# Patient Record
Sex: Female | Born: 1965 | Race: Black or African American | Hispanic: No | Marital: Single | Smoking: Never smoker
Health system: Southern US, Community
[De-identification: ages and names within clinical notes are randomized; demographics above are authoritative.]

## PROBLEM LIST (undated history)

## (undated) DIAGNOSIS — I509 Heart failure, unspecified: Secondary | ICD-10-CM

## (undated) DIAGNOSIS — C801 Malignant (primary) neoplasm, unspecified: Secondary | ICD-10-CM

## (undated) HISTORY — PX: HAND SURGERY: SHX662

## (undated) HISTORY — PX: ABDOMINAL HYSTERECTOMY: SHX81

---

## 2004-10-16 ENCOUNTER — Emergency Department: Payer: Self-pay | Admitting: Emergency Medicine

## 2005-08-20 ENCOUNTER — Ambulatory Visit: Payer: Self-pay | Admitting: Obstetrics and Gynecology

## 2006-04-06 ENCOUNTER — Emergency Department: Payer: Self-pay | Admitting: General Practice

## 2006-10-23 ENCOUNTER — Emergency Department: Payer: Self-pay

## 2006-11-24 ENCOUNTER — Ambulatory Visit: Payer: Self-pay | Admitting: Emergency Medicine

## 2008-01-08 ENCOUNTER — Ambulatory Visit: Payer: Self-pay | Admitting: Family Medicine

## 2009-02-02 HISTORY — PX: BREAST BIOPSY: SHX20

## 2011-06-08 ENCOUNTER — Emergency Department: Payer: Self-pay | Admitting: Emergency Medicine

## 2011-06-08 LAB — CBC
MCHC: 30.9 g/dL — ABNORMAL LOW (ref 32.0–36.0)
MCV: 89 fL (ref 80–100)
Platelet: 295 10*3/uL (ref 150–440)
RBC: 2.82 10*6/uL — ABNORMAL LOW (ref 3.80–5.20)
RDW: 15.2 % — ABNORMAL HIGH (ref 11.5–14.5)
WBC: 8.9 10*3/uL (ref 3.6–11.0)

## 2011-06-08 LAB — COMPREHENSIVE METABOLIC PANEL
Alkaline Phosphatase: 60 U/L (ref 50–136)
BUN: 13 mg/dL (ref 7–18)
Bilirubin,Total: 0.2 mg/dL (ref 0.2–1.0)
Chloride: 101 mmol/L (ref 98–107)
Co2: 28 mmol/L (ref 21–32)
Creatinine: 1.49 mg/dL — ABNORMAL HIGH (ref 0.60–1.30)
Glucose: 90 mg/dL (ref 65–99)
Potassium: 4.1 mmol/L (ref 3.5–5.1)
SGPT (ALT): 11 U/L — ABNORMAL LOW
Sodium: 132 mmol/L — ABNORMAL LOW (ref 136–145)
Total Protein: 10.8 g/dL — ABNORMAL HIGH (ref 6.4–8.2)

## 2011-11-03 DIAGNOSIS — C801 Malignant (primary) neoplasm, unspecified: Secondary | ICD-10-CM

## 2011-11-03 HISTORY — DX: Malignant (primary) neoplasm, unspecified: C80.1

## 2013-04-03 ENCOUNTER — Emergency Department: Payer: Self-pay | Admitting: Emergency Medicine

## 2013-04-03 LAB — COMPREHENSIVE METABOLIC PANEL
AST: 33 U/L (ref 15–37)
Albumin: 3.4 g/dL (ref 3.4–5.0)
Alkaline Phosphatase: 79 U/L
Anion Gap: 4 — ABNORMAL LOW (ref 7–16)
BILIRUBIN TOTAL: 0.6 mg/dL (ref 0.2–1.0)
BUN: 10 mg/dL (ref 7–18)
CALCIUM: 8.8 mg/dL (ref 8.5–10.1)
CHLORIDE: 106 mmol/L (ref 98–107)
CREATININE: 0.82 mg/dL (ref 0.60–1.30)
Co2: 26 mmol/L (ref 21–32)
EGFR (Non-African Amer.): >60
Glucose: 86 mg/dL (ref 65–99)
OSMOLALITY: 270 (ref 275–301)
POTASSIUM: 3.8 mmol/L (ref 3.5–5.1)
SGPT (ALT): 17 U/L (ref 12–78)
Sodium: 136 mmol/L (ref 136–145)
Total Protein: 7.5 g/dL (ref 6.4–8.2)

## 2013-04-03 LAB — CBC WITH DIFFERENTIAL/PLATELET
BASOS ABS: 0.1 10*3/uL (ref 0.0–0.1)
BASOS PCT: 1 %
EOS PCT: 4.3 %
Eosinophil #: 0.2 10*3/uL (ref 0.0–0.7)
HCT: 35.7 % (ref 35.0–47.0)
HGB: 11.1 g/dL — AB (ref 12.0–16.0)
LYMPHS ABS: 1.2 10*3/uL (ref 1.0–3.6)
Lymphocyte %: 21 %
MCH: 27.7 pg (ref 26.0–34.0)
MCHC: 31 g/dL — ABNORMAL LOW (ref 32.0–36.0)
MCV: 89 fL (ref 80–100)
Monocyte #: 0.7 x10 3/mm (ref 0.2–0.9)
Monocyte %: 12.1 %
NEUTROS PCT: 61.6 %
Neutrophil #: 3.6 10*3/uL (ref 1.4–6.5)
Platelet: 313 10*3/uL (ref 150–440)
RBC: 3.99 10*6/uL (ref 3.80–5.20)
RDW: 15.8 % — ABNORMAL HIGH (ref 11.5–14.5)
WBC: 5.8 10*3/uL (ref 3.6–11.0)

## 2013-04-03 LAB — URINALYSIS, COMPLETE
Bacteria: NONE SEEN
Bilirubin,UR: NEGATIVE
Blood: NEGATIVE
Glucose,UR: NEGATIVE mg/dL (ref 0–75)
Leukocyte Esterase: NEGATIVE
Nitrite: NEGATIVE
Ph: 5 (ref 4.5–8.0)
Protein: NEGATIVE
RBC,UR: 2 /HPF (ref 0–5)
Specific Gravity: 1.016 (ref 1.003–1.030)
Squamous Epithelial: 1

## 2014-05-06 ENCOUNTER — Ambulatory Visit
Admit: 2014-05-06 | Disposition: A | Discharge status: Home / Self Care | Payer: Self-pay | Attending: Internal Medicine | Admitting: Internal Medicine

## 2014-11-26 ENCOUNTER — Ambulatory Visit: Payer: Medicaid Other

## 2014-11-26 ENCOUNTER — Encounter: Payer: Self-pay | Admitting: Emergency Medicine

## 2014-11-26 ENCOUNTER — Ambulatory Visit
Admission: EM | Admit: 2014-11-26 | Discharge: 2014-11-26 | Disposition: A | Discharge status: Home / Self Care | Payer: Medicaid Other | Attending: Family Medicine | Admitting: Family Medicine

## 2014-11-26 DIAGNOSIS — X501XXA Overexertion from prolonged static or awkward postures, initial encounter: Secondary | ICD-10-CM | POA: Diagnosis not present

## 2014-11-26 DIAGNOSIS — S86911A Strain of unspecified muscle(s) and tendon(s) at lower leg level, right leg, initial encounter: Secondary | ICD-10-CM | POA: Insufficient documentation

## 2014-11-26 DIAGNOSIS — M25561 Pain in right knee: Secondary | ICD-10-CM | POA: Diagnosis present

## 2014-11-26 DIAGNOSIS — S86811A Strain of other muscle(s) and tendon(s) at lower leg level, right leg, initial encounter: Secondary | ICD-10-CM

## 2014-11-26 MED ORDER — KETOROLAC TROMETHAMINE 60 MG/2ML IM SOLN
60.0000 mg | Freq: Once | INTRAMUSCULAR | Status: AC
  Administered 2014-11-26: 60 mg via INTRAMUSCULAR

## 2014-11-26 MED ORDER — MELOXICAM 15 MG PO TABS: 15.0000 mg | ORAL_TABLET | Freq: Every day | ORAL | Status: DC

## 2014-11-26 NOTE — ED Provider Notes (Signed)
CSN: [id number]     Arrival date & time 11/26/14  1132 History   First MD Initiated Contact with Patient 11/26/14 1212    Nurses notes were reviewed. Chief Complaint  Patient presents with  . Knee Pain   Patient reports right knee started bothering her last week Thursday by 11 days ago. She states she thinks she twisted her knee but does not remember following hitting or injuring her knee otherwise. She saw her PCP last Tuesday for URI and try to mention about the knee pain but the focus was on the URI. She worked this weekend and reports the right knee is bothering her more now she's having pain walking and standing on it.    (Consider location/radiation/quality/duration/timing/severity/associated sxs/prior Treatment) Patient is a 49 y.o. female presenting with knee pain. The history is provided by the patient. No language interpreter was used.  Knee Pain Location:  Knee Injury: yes   Mechanism of injury comment:  Twist Knee location:  R knee Pain details:    Quality:  Shooting, aching and sharp   Radiates to:  Does not radiate   Severity:  Moderate   Timing:  Constant   Progression:  Worsening Chronicity:  New Foreign body present:  No foreign bodies Prior injury to area:  No Relieved by:  Nothing Worsened by:  Activity Ineffective treatments:  None tried Associated symptoms: stiffness and swelling   Associated symptoms: no back pain, no fever, no itching, no muscle weakness and no neck pain   Risk factors: obesity   Risk factors: no concern for non-accidental trauma, no frequent fractures, no known bone disorder and no recent illness     History reviewed. No pertinent past medical history. Past Surgical History  Procedure Laterality Date  . Abdominal hysterectomy    . Hand surgery     History reviewed. No pertinent family history. Social History  Substance Use Topics  . Smoking status: Never Smoker   . Smokeless tobacco: None  . Alcohol Use: No   OB History    No  data available     Review of Systems  Constitutional: Negative for fever.  Respiratory: Positive for cough.        Being treated now for URI  Musculoskeletal: Positive for myalgias, joint swelling and stiffness. Negative for back pain, arthralgias and neck pain.  Skin: Negative for itching.  All other systems reviewed and are negative.   Allergies  Review of patient's allergies indicates no known allergies.  Home Medications   Prior to Admission medications   Medication Sig Start Date End Date Taking? Authorizing Provider  gabapentin (NEURONTIN) 100 MG capsule Take 100 mg by mouth 3 (three) times daily.   Yes Historical Provider, MD  gabapentin (NEURONTIN) 300 MG capsule Take 300 mg by mouth 3 (three) times daily.   Yes Historical Provider, MD  lenalidomide (REVLIMID) 15 MG capsule Take 15 mg by mouth daily.   Yes Historical Provider, MD  oxyCODONE-acetaminophen (PERCOCET) 10-325 MG tablet Take 1 tablet by mouth every 4 (four) hours as needed for pain.   Yes Historical Provider, MD  valACYclovir (VALTREX) 1000 MG tablet Take 1,000 mg by mouth 2 (two) times daily.   Yes Historical Provider, MD  meloxicam (MOBIC) 15 MG tablet Take 1 tablet (15 mg total) by mouth daily. 11/26/14   Frederich Cha, MD   Meds Ordered and Administered this Visit   Medications  ketorolac (TORADOL) injection 60 mg (60 mg Intramuscular Given 11/26/14 1258)    BP 116/65  mmHg  Pulse 81  Temp(Src) 98.4 F (36.9 C) (Oral)  Resp 18  Ht 5\' 3"  (1.6 m)  Wt 248 lb (112.492 kg)  BMI 43.94 kg/m2  SpO2 100% No data found.   Physical Exam  Constitutional: She is oriented to person, place, and time. She appears well-developed and well-nourished.  HENT:  Head: Normocephalic and atraumatic.  Eyes: Conjunctivae are normal. Pupils are equal, round, and reactive to light.  Neck: Normal range of motion. No thyromegaly present.  Musculoskeletal: Normal range of motion. She exhibits no edema.       Right knee: She  exhibits swelling. She exhibits no LCL laxity and no MCL laxity. Tenderness found.       Legs: Neurological: She is alert and oriented to person, place, and time. She has normal reflexes.  Skin: Skin is warm and dry.  Psychiatric: She has a normal mood and affect.  Vitals reviewed.   ED Course  Procedures (including critical care time)  Labs Review Labs Reviewed - No data to display  Imaging Review Dg Knee Complete 4 Views Right  11/26/2014  CLINICAL DATA:  Patient slipped at work 1 week ago, without falling, but with a twisting injury to the right knee. Complaining of anterior knee pain. EXAM: RIGHT KNEE - COMPLETE 4+ VIEW COMPARISON:  None. FINDINGS: There is no evidence of fracture, dislocation, or joint effusion. There is no evidence of arthropathy or other focal bone abnormality. Soft tissues are unremarkable. IMPRESSION: Negative. Electronically Signed   By: Lajean Manes M.D.   On: 11/26/2014 12:58     Visual Acuity Review  Right Eye Distance:   Left Eye Distance:   Bilateral Distance:    Right Eye Near:   Left Eye Near:    Bilateral Near:         MDM   1. Knee strain, right, initial encounter    Patient was informed x-ray was negative. She was given Mobic for pain as an outpatient and to follow-up with her PCP on the ninth is scheduled if not better. Recommend cryotherapy. We'll give her a note for work for today and tomorrow. And should be noted that shot of Toradol 60 mg IM was also ordered patient with some improvement. She was also instructed to get a sleeve for the right knee for support.    Frederich Cha, MD 11/26/14 [phone]

## 2014-11-26 NOTE — Discharge Instructions (Signed)
Cryotherapy Cryotherapy is when you put ice on your injury. Ice helps lessen pain and puffiness (swelling) after an injury. Ice works the best when you start using it in the first 24 to 48 hours after an injury. HOME CARE  Put a dry or damp towel between the ice pack and your skin.  You may press gently on the ice pack.  Leave the ice on for no more than 10 to 20 minutes at a time.  Check your skin after 5 minutes to make sure your skin is okay.  Rest at least 20 minutes between ice pack uses.  Stop using ice when your skin loses feeling (numbness).  Do not use ice on someone who cannot tell you when it hurts. This includes small children and people with memory problems (dementia). GET HELP RIGHT AWAY IF:  You have white spots on your skin.  Your skin turns blue or pale.  Your skin feels waxy or hard.  Your puffiness gets worse. MAKE SURE YOU:   Understand these instructions.  Will watch your condition.  Will get help right away if you are not doing well or get worse.   This information is not intended to replace advice given to you by your health care provider. Make sure you discuss any questions you have with your health care provider.   Document Released: 07/08/2007 Document Revised: 04/13/2011 Document Reviewed: 09/11/2010 Elsevier Interactive Patient Education 2016 [place].  Muscle Strain A muscle strain (pulled muscle) happens when a muscle is stretched beyond normal length. It happens when a sudden, violent force stretches your muscle too far. Usually, a few of the fibers in your muscle are torn. Muscle strain is common in athletes. Recovery usually takes 1-2 weeks. Complete healing takes 5-6 weeks.  HOME CARE   Follow the PRICE method of treatment to help your injury get better. Do this the first 2-3 days after the injury:  Protect. Protect the muscle to keep it from getting injured again.  Rest. Limit your activity and rest the injured body part.  Ice.  Put ice in a plastic bag. Place a towel between your skin and the bag. Then, apply the ice and leave it on from 15-20 minutes each hour. After the third day, switch to moist heat packs.  Compression. Use a splint or elastic bandage on the injured area for comfort. Do not put it on too tightly.  Elevate. Keep the injured body part above the level of your heart.  Only take medicine as told by your doctor.  Warm up before doing exercise to prevent future muscle strains. GET HELP IF:   You have more pain or puffiness (swelling) in the injured area.  You feel numbness, tingling, or notice a loss of strength in the injured area. MAKE SURE YOU:   Understand these instructions.  Will watch your condition.  Will get help right away if you are not doing well or get worse.   This information is not intended to replace advice given to you by your health care provider. Make sure you discuss any questions you have with your health care provider.   Document Released: 10/29/2007 Document Revised: 11/09/2012 Document Reviewed: 08/18/2012 Elsevier Interactive Patient Education [employer].

## 2014-11-26 NOTE — ED Notes (Signed)
Patient states she is having a lot of pain in her left knee, doesn't remember any injury, started hurting on the 13th. Saw her PCP last week but didn't address the knee pain

## 2014-12-07 ENCOUNTER — Other Ambulatory Visit: Payer: Self-pay | Admitting: Family Medicine

## 2014-12-10 ENCOUNTER — Other Ambulatory Visit: Payer: Self-pay | Admitting: Family Medicine

## 2014-12-10 DIAGNOSIS — Z1231 Encounter for screening mammogram for malignant neoplasm of breast: Secondary | ICD-10-CM

## 2014-12-12 ENCOUNTER — Ambulatory Visit
Admission: RE | Admit: 2014-12-12 | Discharge: 2014-12-12 | Disposition: A | Discharge status: Home / Self Care | Payer: Medicaid Other | Source: Ambulatory Visit | Attending: Family Medicine | Admitting: Family Medicine

## 2014-12-12 DIAGNOSIS — Z1231 Encounter for screening mammogram for malignant neoplasm of breast: Secondary | ICD-10-CM | POA: Insufficient documentation

## 2014-12-12 HISTORY — DX: Malignant (primary) neoplasm, unspecified: C80.1

## 2014-12-17 ENCOUNTER — Other Ambulatory Visit: Payer: Self-pay | Admitting: *Deleted

## 2014-12-17 ENCOUNTER — Inpatient Hospital Stay
Admission: RE | Admit: 2014-12-17 | Discharge: 2014-12-17 | Disposition: A | Discharge status: Home / Self Care | Payer: Self-pay | Source: Ambulatory Visit | Attending: *Deleted | Admitting: *Deleted

## 2014-12-17 DIAGNOSIS — Z9289 Personal history of other medical treatment: Secondary | ICD-10-CM

## 2015-10-14 ENCOUNTER — Ambulatory Visit
Admission: EM | Admit: 2015-10-14 | Discharge: 2015-10-14 | Disposition: A | Discharge status: Home / Self Care | Payer: Managed Care, Other (non HMO) | Attending: Family Medicine | Admitting: Family Medicine

## 2015-10-14 DIAGNOSIS — S76312A Strain of muscle, fascia and tendon of the posterior muscle group at thigh level, left thigh, initial encounter: Secondary | ICD-10-CM | POA: Diagnosis not present

## 2015-10-14 MED ORDER — KETOROLAC TROMETHAMINE 60 MG/2ML IM SOLN
60.0000 mg | Freq: Once | INTRAMUSCULAR | Status: AC
  Administered 2015-10-14: 60 mg via INTRAMUSCULAR

## 2015-10-14 MED ORDER — CYCLOBENZAPRINE HCL 10 MG PO TABS: 10.0000 mg | ORAL_TABLET | Freq: Every day | ORAL | 0 refills | Status: DC

## 2015-10-14 NOTE — ED Triage Notes (Signed)
Patient c/o of leg pain on the back of her left upper leg. She states she got a new bed and its higher upper and on Saturday she stepped out of bed and felt something rip or tear in her leg.

## 2015-10-14 NOTE — ED Provider Notes (Signed)
[facility]    CSN: [id number] Arrival date & time: 10/14/15  1849  First Provider Contact:  None       History   Chief Complaint Chief Complaint  Patient presents with  . Leg Pain    HPI Carly Randall is a 50 y.o. female.   50 yo female with a c/o 3 days h/o left upper leg back on the back of the leg. States 3 days ago was getting out of her new bed and felt a sudden pull and pain to the back of the upper left leg. Denies any falls or direct trauma. Denies fevers, chills, numbness/tingling.    The history is provided by the patient.  Leg Pain    Past Medical History:  Diagnosis Date  . Cancer ([place]) 11/2011   multiple myeloma    There are no active problems to display for this patient.   Past Surgical History:  Procedure Laterality Date  . ABDOMINAL HYSTERECTOMY    . BREAST BIOPSY Right 2011   neg  . HAND SURGERY      OB History    No data available       Home Medications    Prior to Admission medications   Medication Sig Start Date End Date Taking? Authorizing Provider  gabapentin (NEURONTIN) 100 MG capsule Take 100 mg by mouth 3 (three) times daily.   Yes Historical Provider, MD  gabapentin (NEURONTIN) 300 MG capsule Take 300 mg by mouth 3 (three) times daily.   Yes Historical Provider, MD  lenalidomide (REVLIMID) 15 MG capsule Take 15 mg by mouth daily.   Yes Historical Provider, MD  meloxicam (MOBIC) 15 MG tablet Take 1 tablet (15 mg total) by mouth daily. 11/26/14  Yes Frederich Cha, MD  oxyCODONE-acetaminophen (PERCOCET) 10-325 MG tablet Take 1 tablet by mouth every 4 (four) hours as needed for pain.   Yes Historical Provider, MD  valACYclovir (VALTREX) 1000 MG tablet Take 1,000 mg by mouth 2 (two) times daily.   Yes Historical Provider, MD  cyclobenzaprine (FLEXERIL) 10 MG tablet Take 1 tablet (10 mg total) by mouth at bedtime. 10/14/15   Norval Gable, MD    Family History History reviewed. No pertinent family  history.  Social History Social History  Substance Use Topics  . Smoking status: Never Smoker  . Smokeless tobacco: Never Used  . Alcohol use No     Allergies   Percocet [oxycodone-acetaminophen]   Review of Systems Review of Systems   Physical Exam Triage Vital Signs ED Triage Vitals  Enc Vitals Group     BP 10/14/15 1900 97/72     Pulse Rate 10/14/15 1900 82     Resp 10/14/15 1900 18     Temp 10/14/15 1900 97.9 F (36.6 C)     Temp Source 10/14/15 1900 Oral     SpO2 10/14/15 1900 100 %     Weight 10/14/15 1901 255 lb (115.7 kg)     Height 10/14/15 1901 _0  (1.626 m)     Head Circumference --      Peak Flow --      Pain Score 10/14/15 1905 6     Pain Loc --      Pain Edu? --      Excl. in [place]? --    No data found.   Updated Vital Signs BP 97/72 (BP Location: Left Arm)   Pulse 82   Temp 97.9 F (36.6 C) (Oral)   Resp 18  Ht _0  (1.626 m)   Wt 255 lb (115.7 kg)   SpO2 100%   BMI 43.77 kg/m   Visual Acuity Right Eye Distance:   Left Eye Distance:   Bilateral Distance:    Right Eye Near:   Left Eye Near:    Bilateral Near:     Physical Exam  Constitutional: She appears well-developed and well-nourished. No distress.  Musculoskeletal:       Left upper leg: She exhibits no bony tenderness, no swelling, no edema, no deformity and no laceration. Tenderness: left hamstring muscle tenderness to palpation.  Skin: She is not diaphoretic.  Nursing note and vitals reviewed.    UC Treatments / Results  Labs (all labs ordered are listed, but only abnormal results are displayed) Labs Reviewed - No data to display  EKG  EKG Interpretation None       Radiology No results found.  Procedures Procedures (including critical care time)  Medications Ordered in UC Medications  ketorolac (TORADOL) injection 60 mg (60 mg Intramuscular Given 10/14/15 1957)     Initial Impression / Assessment and Plan / UC Course  I have reviewed the triage vital  signs and the nursing notes.  Pertinent labs & imaging results that were available during my care of the patient were reviewed by me and considered in my medical decision making (see chart for details).  Clinical Course     Final Clinical Impressions(s) / UC Diagnoses   Final diagnoses:  Hamstring strain, left, initial encounter    New Prescriptions New Prescriptions   CYCLOBENZAPRINE (FLEXERIL) 10 MG TABLET    Take 1 tablet (10 mg total) by mouth at bedtime.   1. diagnosis reviewed with patient 2. Patient given toradol 93m IM x 1 3. rx as per orders above; reviewed possible side effects, interactions, risks and benefits  4. Recommend supportive treatment with ice, rest, otc analgesics, pain medication that patient has at home 5. Follow-up prn if symptoms worsen or don't improve   ONorval Gable MD 10/14/15 2003

## 2018-10-25 ENCOUNTER — Ambulatory Visit
Admission: EM | Admit: 2018-10-25 | Discharge: 2018-10-25 | Disposition: A | Discharge status: Home / Self Care | Payer: Managed Care, Other (non HMO) | Attending: Family Medicine | Admitting: Family Medicine

## 2018-10-25 ENCOUNTER — Other Ambulatory Visit: Payer: Self-pay

## 2018-10-25 DIAGNOSIS — M7918 Myalgia, other site: Secondary | ICD-10-CM

## 2018-10-25 MED ORDER — METAXALONE 800 MG PO TABS: 800.0000 mg | ORAL_TABLET | Freq: Three times a day (TID) | ORAL | 0 refills | Status: DC | PRN

## 2018-10-25 NOTE — ED Provider Notes (Signed)
[facility]    CSN: [id number] Arrival date & time: 10/25/18  1425  History   Chief Complaint Chief Complaint  Patient presents with  . Shoulder Pain    right   HPI  53 year old female presents with the above complaint.  Patient reports that she has had right shoulder pain intermittently for months.  She states that it has been going on since July.  She reports that it has been worse since Saturday.  She localizes the pain to the trapezius muscle and around the scapula.  She also seems to have pain over the [facility] joint.  She reports decreased range of motion.  She has taken Tylenol without relief.  She rates her pain is 10/10 in severity.  She states that it is worse with pressure and she has difficulty wearing her bra as a result.  Worse with range of motion as well.  No relieving factors.  No other associated symptoms.  No other complaints.  PMH, Surgical Hx, Family Hx, Social History reviewed and updated as below.  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    Surgical Hx:  BREAST SURGERY   benign biopsy   HYSTERECTOMY 02/03/2008 - 02/01/2009  partial   BONE MARROW TRANSPLANT      CARPAL TUNNEL RELEASE  Right    CARPAL TUNNEL RELEASE  Left    PR CATH PLACE/CORON ANGIO, IMG SUPER/INTERP,R&L HRT CATH, L HRT VENTRIC 05/30/2015 N/A Procedure: Left/Right Heart Catheterization; Surgeon: Melvern Banker, MD; Location: [facility] CATH; Service: Cardiology    ENDOMETRIAL ABLATION 02/03/1996 - 02/01/1997     UTERINE FIBROID SURGERY      BREAST EXCISIONAL BIOPSY 02/02/2009 - 02/01/2010 Right    PR COLONOSCOPY FLX DX W/COLLJ SPEC WHEN PFRMD 01/31/2016 N/A Procedure: COLONOSCOPY, FLEXIBLE, PROXIMAL TO SPLENIC FLEXURE; DIAGNOSTIC, W/WO COLLECTION SPECIMEN BY BRUSH OR [place]; Surgeon: Valma Cava, MD; Location: [facility] PROCEDURES [facility]; Service: Gastroenterology   PR INSJ/RPLCMT PERM DFB W/TRNSVNS LDS 1/DUAL [facility] 05/13/2017 N/A Procedure: ICD Implant System (Single/Dual); Surgeon: Arcola Jansky, MD; Location: [facility] EP; Service: Cardiology  Medical devices from this surgery are in the Implants section.   PR COLONOSCOPY FLX DX W/COLLJ SPEC WHEN PFRMD 05/25/2017 N/A Procedure: COLONOSCOPY, FLEXIBLE, PROXIMAL TO SPLENIC FLEXURE; DIAGNOSTIC, W/WO COLLECTION SPECIMEN BY BRUSH OR [place]; Surgeon: Traci Sermon, MD; [facility]: [facility]; Service: Gastroenterology   PR UPPER GI ENDOSCOPY,DIAGNOSIS 05/25/2017 N/A Procedure: UGI ENDO, INCLUDE ESOPHAGUS, STOMACH, & DUODENUM &/OR JEJUNUM; DX W/WO COLLECTION SPECIMN, BY BRUSH OR [place]; Surgeon: Traci Sermon, MD; [facility]: [facility]; Service: Gastroenterology   PR GI IMAG INTRALUMINAL ESOPHAGUS-ILEUM W/I&R 08/20/2017 N/A Procedure: GI VIDEO TRACT IMAGE INTRALUMINAL (EG, CAPSULE ENDOSCOPY), ESOPHAGUS VIA ILEUM, PHYSICIAN INTERPRETATION & REPORT; Surgeon: Physician Giproc; [facility]: [facility]; Service: Gastroenterology   PR DESTRUCT INTERNAL HEMORRHOID, THERMAL 09/22/2017 N/A Procedure: DESTRUCTION OF INTERNAL HEMORRHOID(S) BY THERMAL ENERGY; Surgeon: Susy Frizzle, MD; Location: GI PROCEDURES [facility]; Service: Gastroenterology   PR PERQ CLSR TCAT L ATR APNDGE W/ENDOCARDIAL IMPLNT 04/20/2018 N/A Procedure: Left Atrial Appendage Closure; Surgeon: Arcola Jansky, MD; Location: [facility] EP; Service: Cardiology  Medical devices from this  surgery are in the Implants section.   PR EPHYS EVL TRNSPTL TX ATRIAL FIB ISOLAT PULM VEIN 07/28/2018 N/A Procedure: Afib Ablation; Surgeon: Arcola Jansky, MD; Location: [facility] EP; Service: Cardiology     Home Medications    Prior to Admission medications   Medication Sig Start Date End Date Taking? Authorizing Provider  amitriptyline (ELAVIL) 25 MG tablet Take by mouth. 07/19/17  Yes [provider]  aspirin EC 81 MG tablet Take by mouth.   Yes [provider]  carvedilol (COREG) 6.25 MG tablet TK 1 T PO BID 10/15/18  Yes [provider]  clopidogrel (PLAVIX) 75 MG tablet Take by mouth. 10/07/18  Yes [provider]  furosemide (LASIX) 20 MG tablet TAKE ONE TABLET BY MOUTH ONCE DAILY 08/18/16  Yes [provider]  gabapentin (NEURONTIN) 100 MG capsule Take 100 mg by mouth 3 (three) times daily.   Yes [provider]  gabapentin (NEURONTIN) 300 MG capsule Take 300 mg by mouth 3 (three) times daily.   Yes [provider]  lenalidomide (REVLIMID) 15 MG capsule Take 15 mg by mouth daily.   Yes [provider]  magnesium 30 MG tablet Take 30 mg by mouth 2 (two) times daily.   Yes [provider]  potassium chloride (KLOR-CON) 20 MEQ packet Take by mouth 2 (two) times daily.   Yes [provider]  sacubitril-valsartan (ENTRESTO) 49-51 MG Take by mouth. 03/04/18 03/04/19 Yes [provider]  valACYclovir (VALTREX) 1000 MG tablet Take 1,000 mg by mouth 2 (two) times daily.   Yes [provider]  metaxalone (SKELAXIN) 800 MG tablet Take 1 tablet (800 mg total) by mouth 3 (three) times daily as needed for muscle spasms (Muscle pain). 10/25/18   Coral Spikes, DO    Family History No Known Problems Brother    Heart failure Father Tanasia Budzinski NICM, LVAD  No Known Problems Maternal Aunt    Stroke Maternal Grandfather Valetta Mole   Cancer Maternal Grandmother Sharyne Peach Lung   No Known Problems Maternal Uncle    Hypothyroidism Mother    No Known Problems Paternal Aunt    Cancer Paternal Grandfather Momoka Stringfield Prostate  Diabetes Paternal Grandmother Caesar Chestnut     Social History Social History   Tobacco Use  . Smoking status: Never Smoker  . Smokeless tobacco: Never Used  Substance Use Topics  . Alcohol use: No  . Drug use: Never    Allergies   Percocet [oxycodone-acetaminophen]   Review of Systems Review of Systems  Constitutional: Negative.   Musculoskeletal:       Shoulder pain.   Physical Exam Triage Vital Signs ED Triage Vitals  Enc Vitals Group     BP 10/25/18 1444 (!) 135/44     Pulse Rate 10/25/18 1444 64     Resp 10/25/18 1444 16     Temp 10/25/18 1444 98 F (36.7 C)     Temp Source 10/25/18 1444 Oral     SpO2 10/25/18 1444 100 %     Weight 10/25/18 1440 255 lb (115.7 kg)     Height 10/25/18 1440 '5\' 4"'  (1.626 m)     Head Circumference --      Peak Flow --      Pain Score  10/25/18 1440 10     Pain Loc --      Pain Edu? --      Excl. in [place]? --    Updated Vital Signs BP (!) 135/44 (BP Location: Left Arm)   Pulse 64   Temp 98 F (36.7 C) (Oral)   Resp 16   Ht '5\' 4"'  (1.626 m)   Wt 115.7 kg   SpO2 100%   BMI 43.77 kg/m   Visual Acuity Right Eye Distance:   Left Eye Distance:   Bilateral Distance:    Right Eye Near:   Left Eye Near:    Bilateral Near:     Physical Exam Vitals signs and nursing note reviewed.  Constitutional:      General: She is not in acute distress.    Appearance: Normal appearance. She is obese. She is not ill-appearing.  HENT:     Head: Normocephalic and atraumatic.  Eyes:     General:        Right eye: No discharge.        Left eye: No discharge.     Conjunctiva/sclera: Conjunctivae normal.  Cardiovascular:     Rate and Rhythm: Normal rate and regular rhythm.     Heart sounds: No murmur.  Pulmonary:     Effort: Pulmonary effort is normal.     Breath sounds: Normal  breath sounds. No wheezing, rhonchi or rales.  Musculoskeletal:     Comments: Right shoulder -inspection unremarkable.  Tenderness over the trapezius muscle as well as the right AC joint.  Normal rotator cuff strength.  No tenderness over the bicipital groove.  Neurological:     Mental Status: She is alert.  Psychiatric:        Mood and Affect: Mood normal.        Behavior: Behavior normal.    UC Treatments / Results  Labs (all labs ordered are listed, but only abnormal results are displayed) Labs Reviewed - No data to display  EKG   Radiology No results found.  Procedures Procedures (including critical care time)  Medications Ordered in UC Medications - No data to display  Initial Impression / Assessment and Plan / UC Course  I have reviewed the triage vital signs and the nursing notes.  Pertinent labs & imaging results that were available during my care of the patient were reviewed by me and considered in my medical decision making (see chart for details).    53 year old female presents with musculoskeletal pain.  This appears to be primarily muscular in nature.  She has good rotator cuff strength.  She is exquisitely tender over the trapezius muscle even with minimal palpation.  Skelaxin as directed.  Advised heat.  Advised to see Ortho if she fails to improve or worsens.  Final Clinical Impressions(s) / UC Diagnoses   Final diagnoses:  Musculoskeletal pain     Discharge Instructions     Rest.  Heat.  Medication as needed.  If persists, see Ortho.  Take care  Dr. Lacinda Axon    ED Prescriptions    Medication Sig Dispense Auth. Provider   metaxalone (SKELAXIN) 800 MG tablet Take 1 tablet (800 mg total) by mouth 3 (three) times daily as needed for muscle spasms (Muscle pain). 30 tablet Coral Spikes, DO     PDMP not reviewed this encounter.   Coral Spikes, [place] 10/25/18 1533

## 2018-10-25 NOTE — Discharge Instructions (Signed)
Rest.  Heat.  Medication as needed.  If persists, see Ortho.  Take care  Dr. Lacinda Axon

## 2018-10-25 NOTE — ED Triage Notes (Signed)
Patient complains of right shoulder pain that started on Saturday, no injury. Pain worse with raising arm.

## 2018-12-28 ENCOUNTER — Ambulatory Visit (INDEPENDENT_AMBULATORY_CARE_PROVIDER_SITE_OTHER): Payer: Managed Care, Other (non HMO)

## 2018-12-28 ENCOUNTER — Encounter: Payer: Self-pay | Admitting: Emergency Medicine

## 2018-12-28 ENCOUNTER — Other Ambulatory Visit: Payer: Self-pay

## 2018-12-28 ENCOUNTER — Ambulatory Visit
Admission: EM | Admit: 2018-12-28 | Discharge: 2018-12-28 | Disposition: A | Discharge status: Home / Self Care | Payer: Managed Care, Other (non HMO) | Attending: Emergency Medicine | Admitting: Emergency Medicine

## 2018-12-28 DIAGNOSIS — M25572 Pain in left ankle and joints of left foot: Secondary | ICD-10-CM

## 2018-12-28 HISTORY — DX: Heart failure, unspecified: I50.9

## 2018-12-28 MED ORDER — HYDROCODONE-ACETAMINOPHEN 5-325 MG PO TABS: 1.0000 | ORAL_TABLET | Freq: Four times a day (QID) | ORAL | 0 refills | Status: DC | PRN

## 2018-12-28 NOTE — Discharge Instructions (Addendum)
Your x-ray showed that you have soft tissue swelling, but no fracture, or lesions concerning for metastasis. wear the ASO for comfort.  Ice for 20 minutes at a time, elevate.  Be careful with icing - make sure that you do not have ice directly against your skin.  Use a cane. Tylenol containing product 3 or 4 times a day as needed for pain.  1000 mg of Tylenol for mild to moderate pain, 1-2 Norco for severe pain.  Follow-up with [facility] if you are not getting better in a week

## 2018-12-28 NOTE — ED Provider Notes (Signed)
HPI  SUBJECTIVE:  Carly Randall is a 53 y.o. female who presents with the acute onset of atraumatic left ankle pain starting yesterday.  States that she woke up with it.  She reports limited range of motion secondary to the pain.  No fevers, body aches, joint erythema, swelling, change in physical activity, fall.  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  She tried Tylenol 1000 mg 3 times daily and using a cane without improvement in her symptoms.  Symptoms are worse with walking, ankle movement.  She has never had symptoms like this before.  She has a past medical history of multiple myeloma in October 2013 status post bone marrow transplant.  She reports prolonged steroid use post transplant.  She is not on any current immunosuppression medications.  She also has a history of CHF, peripheral neuropathy is on Plavix.  No history of gout, left ankle injury, diabetes, osteoporosis.  Family history negative for gout.  PMD: Dr. Posey Pronto at [place]    Past Medical History:  Diagnosis Date  . Cancer ([place]) 11/2011   multiple myeloma  . Congestive heart failure (CHF) [facility])     Past Surgical History:  Procedure Laterality Date  . ABDOMINAL HYSTERECTOMY    . BREAST BIOPSY Right 2011   neg  . HAND SURGERY      Family History  Problem Relation Age of Onset  . Hyperthyroidism Mother   . Heart disease Father     Social History   Tobacco Use  . Smoking status: Never Smoker  . Smokeless tobacco: Never Used  Substance Use Topics  . Alcohol use: No  . Drug use: Never    No current facility-administered medications for this encounter.   Current Outpatient Medications:  .  amitriptyline (ELAVIL) 25 MG tablet, Take by mouth., Disp: , Rfl:  .  aspirin EC 81 MG tablet, Take by mouth., Disp: , Rfl:  .  carvedilol (COREG) 6.25 MG tablet, TK 1 T PO BID, Disp: , Rfl:  .   furosemide (LASIX) 20 MG tablet, TAKE ONE TABLET BY MOUTH ONCE DAILY, Disp: , Rfl:  .  gabapentin (NEURONTIN) 100 MG capsule, Take 100 mg by mouth 3 (three) times daily., Disp: , Rfl:  .  gabapentin (NEURONTIN) 300 MG capsule, Take 300 mg by mouth 3 (three) times daily., Disp: , Rfl:  .  lenalidomide (REVLIMID) 15 MG capsule, Take 15 mg by mouth daily., Disp: , Rfl:  .  magnesium 30 MG tablet, Take 30 mg by mouth 2 (two) times daily., Disp: , Rfl:  .  potassium chloride (KLOR-CON) 20 MEQ packet, Take by mouth 2 (two) times daily., Disp: , Rfl:  .  sacubitril-valsartan (ENTRESTO) 49-51 MG, Take by mouth., Disp: , Rfl:  .  valACYclovir (VALTREX) 1000 MG tablet, Take 1,000 mg by mouth 2 (two) times daily., Disp: , Rfl:  .  clopidogrel (PLAVIX) 75 MG tablet, Take by mouth., Disp: , Rfl:  .  HYDROcodone-acetaminophen (NORCO/VICODIN) 5-325 MG tablet, Take 1-2 tablets by mouth every 6 (six) hours as needed for moderate pain or severe pain., Disp: 12 tablet, Rfl: 0  Allergies  Allergen Reactions  . Filgrastim Other (See Comments)    Other reaction(s): Other (See Comments) Other reaction(s): Other (See Comments) Severe pain Severe pain Severe pain   . Pegfilgrastim     Other reaction(s): Other (See Comments) Severe bone pain Other reaction(s): Other (  See Comments) Severe bone pain   . Percocet [Oxycodone-Acetaminophen] Itching  . Codeine Itching  . Naproxen Itching     ROS  As noted in HPI.   Physical Exam  BP 109/80 (BP Location: Right Arm) Comment (BP Location): forearm  Pulse 61   Temp 98.4 F (36.9 C) (Oral)   Resp 18   Ht '5\' 4"'  (1.626 m)   Wt 122.5 kg   SpO2 100%   BMI 46.35 kg/m   Constitutional: Well developed, well nourished, no acute distress Eyes:  EOMI, conjunctiva normal bilaterally HENT: Normocephalic, atraumatic,mucus membranes moist Respiratory: Normal inspiratory effort Cardiovascular: Normal rate GI: nondistended skin: No rash, skin  intact Musculoskeletal:L Ankle Tenderness entire joint, positive edema. Edema  Symmetric with other ankle.  No erythema.  Pain with plantarflexion/dorsiflexion.  No pain with inversion/eversion.  Proximal fibula NT, Distal fibula  tender, Medial malleolus tender,  Deltoid ligament medially tender,  Lateral ligaments tender, ATFL laterally tender, calcaneofibular ligament laterally NT, posterior tablofibular ligament laterally NT ,  Achilles tender, no palpable tissue defect. negative Thompson test. calcaneus NT,  Proximal 5th metatarsal NT, Midfoot NT, distal NVI with baseline sensation / motor to foot at baseline.  DP 2+. - bruising.  Pt able to bear weight in dept. with difficulty  neurologic: Alert & oriented x 3, no focal neuro deficits Psychiatric: Speech and behavior appropriate   ED Course   Medications - No data to display  Orders Placed This Encounter  Procedures  . DG Ankle Complete Left    Standing Status:   Standing    Number of Occurrences:   1    Order Specific Question:   Reason for Exam (SYMPTOM  OR DIAGNOSIS REQUIRED)    Answer:   Diffuse tenderness.  History of multiple myeloma, prolonged prednisone use.  Rule out fracture, effusion.  Marland Kitchen Apply ASO ankle    Standing Status:   Standing    Number of Occurrences:   1    Order Specific Question:   Laterality    Answer:   Left    No results found for this or any previous visit (from the past 24 hour(s)). Dg Ankle Complete Left  Result Date: 12/28/2018 CLINICAL DATA:  Diffuse tenderness with a history of multiple myeloma. EXAM: LEFT ANKLE COMPLETE - 3+ VIEW COMPARISON:  10/16/2004 FINDINGS: There is soft tissue swelling about the ankle without evidence for an acute displaced fracture or dislocation. There is no evidence for lytic lesion. IMPRESSION: Soft tissue swelling without evidence for an acute displaced fracture or dislocation. No lytic lesion identified. Electronically Signed   By: Constance Holster M.D.   On:  12/28/2018 19:39    ED Clinical Impression  1. Acute left ankle pain      ED Assessment/Plan  [place] Narcotic database reviewed for this patient, and feel that the risk/benefit ratio today is favorable for proceeding with a prescription for controlled substance.  No opiate prescriptions since October 2019.  Had a prescription for Norco which she states that she tolerated.   Imaging L ankle b/c of history of multiple myeloma to rule out mets, fracture because of the prolonged steroid use, effusion.  Doubt gout, infection.  Reviewed imaging independently.  Soft tissue swelling.  No acute displaced fracture or dislocation.  No lytic lesions identified.  See radiology report for full details.  Home with Tylenol containing product 3 or 4 times a day as needed for pain.  1000 mg of Tylenol for mild to moderate pain, 1-2 Norco  for severe pain.  ASO.  She states that she has a cane at home.   Follow-up with [facility] if not better in a week.  Discussed  imaging, MDM, treatment plan, and plan for follow-up with patient. Discussed sn/sx that should prompt return to the ED. patient agrees with plan.   Meds ordered this encounter  Medications  . HYDROcodone-acetaminophen (NORCO/VICODIN) 5-325 MG tablet    Sig: Take 1-2 tablets by mouth every 6 (six) hours as needed for moderate pain or severe pain.    Dispense:  12 tablet    Refill:  0    *This clinic note was created using [employer]. Therefore, there may be occasional mistakes despite careful proofreading.   ?    Melynda Ripple, MD 12/29/18 1200

## 2018-12-28 NOTE — ED Triage Notes (Signed)
Patient in today c/o left ankle pain x 1 day. No injury noted. Patient states she just woke up with the pain yesterday. Patient has used OTC Tylenol and soaked it.

## 2018-12-30 ENCOUNTER — Telehealth: Payer: Self-pay | Admitting: Emergency Medicine

## 2018-12-30 ENCOUNTER — Telehealth: Payer: Self-pay | Admitting: Family Medicine

## 2018-12-30 MED ORDER — HYDROCODONE-ACETAMINOPHEN 5-325 MG PO TABS: 1.0000 | ORAL_TABLET | Freq: Four times a day (QID) | ORAL | 0 refills | Status: DC | PRN

## 2018-12-30 NOTE — Telephone Encounter (Signed)
Re-ordered vicodin as pharmacy called that rx from 11/25 did not go through

## 2018-12-30 NOTE — Telephone Encounter (Signed)
Patient called stating her script for Hydrocodone was not received by [facility] on 11/25. Checked script and saw that transmission had failed to [facility] on 11/25. Advised patient that we would resend to [facility]. Provider re sent prescription but it was sent to [facility] instead. Called and canceled script at [facility] and had provider re send script to [facility]. Patient called back and stated this RN did not  Call her script in. This RN advised patient that this was a script that could not be called in and the provider was re sending the prescription to [facility]. Patient got upset with this RN.

## 2018-12-30 NOTE — Telephone Encounter (Signed)
Resent to [facility]

## 2019-04-14 ENCOUNTER — Ambulatory Visit: Payer: Managed Care, Other (non HMO)

## 2019-05-13 ENCOUNTER — Ambulatory Visit: Payer: Managed Care, Other (non HMO)

## 2019-05-13 ENCOUNTER — Ambulatory Visit: Payer: Managed Care, Other (non HMO) | Attending: Internal Medicine

## 2019-05-13 DIAGNOSIS — Z23 Encounter for immunization: Secondary | ICD-10-CM

## 2019-05-13 NOTE — Progress Notes (Signed)
   Covid-19 Vaccination Clinic  Name:  [name]    MRN: [id number] DOB: March 31, 1965  05/13/2019  [name] was observed post Covid-19 immunization for 15 minutes without incident. She was provided with Vaccine Information Sheet and instruction to access the V-Safe system.   [name] was instructed to call 911 with any severe reactions post vaccine: Marland Kitchen Difficulty breathing  . Swelling of face and throat  . A fast heartbeat  . A bad rash all over body  . Dizziness and weakness   Immunizations Administered    Name Date Dose VIS Date Route   Pfizer COVID-19 Vaccine 05/13/2019  8:34 AM 0.3 mL 01/13/2019 Intramuscular   Manufacturer: [place]   Lot: [id number]   [place]: [id number]

## 2019-06-13 ENCOUNTER — Ambulatory Visit: Payer: Managed Care, Other (non HMO)

## 2019-06-13 ENCOUNTER — Ambulatory Visit: Payer: Managed Care, Other (non HMO) | Attending: Internal Medicine

## 2019-06-13 DIAGNOSIS — Z23 Encounter for immunization: Secondary | ICD-10-CM

## 2019-06-13 NOTE — Progress Notes (Signed)
   Covid-19 Vaccination Clinic  Name:  Carly Randall    MRN: [id number] DOB: 1965/04/28  06/13/2019  [name] was observed post Covid-19 immunization for 15 minutes without incident. She was provided with Vaccine Information Sheet and instruction to access the V-Safe system.   [name] was instructed to call 911 with any severe reactions post vaccine: Marland Kitchen Difficulty breathing  . Swelling of face and throat  . A fast heartbeat  . A bad rash all over body  . Dizziness and weakness   Immunizations Administered    Name Date Dose VIS Date Route   Pfizer COVID-19 Vaccine 06/13/2019  8:15 AM 0.3 mL 03/29/2018 Intramuscular   Manufacturer: [place]   Lot: [id number]   [place]: [id number]

## 2020-09-24 IMAGING — CR DG ANKLE COMPLETE 3+V*L*
3 series · 3 of 3 positions shown · non-contrast
Comparison: 10/16/2004

CLINICAL DATA: Diffuse tenderness with a history of multiple
myeloma.

EXAM:
LEFT ANKLE COMPLETE - 3+ VIEW

[ankle ap]
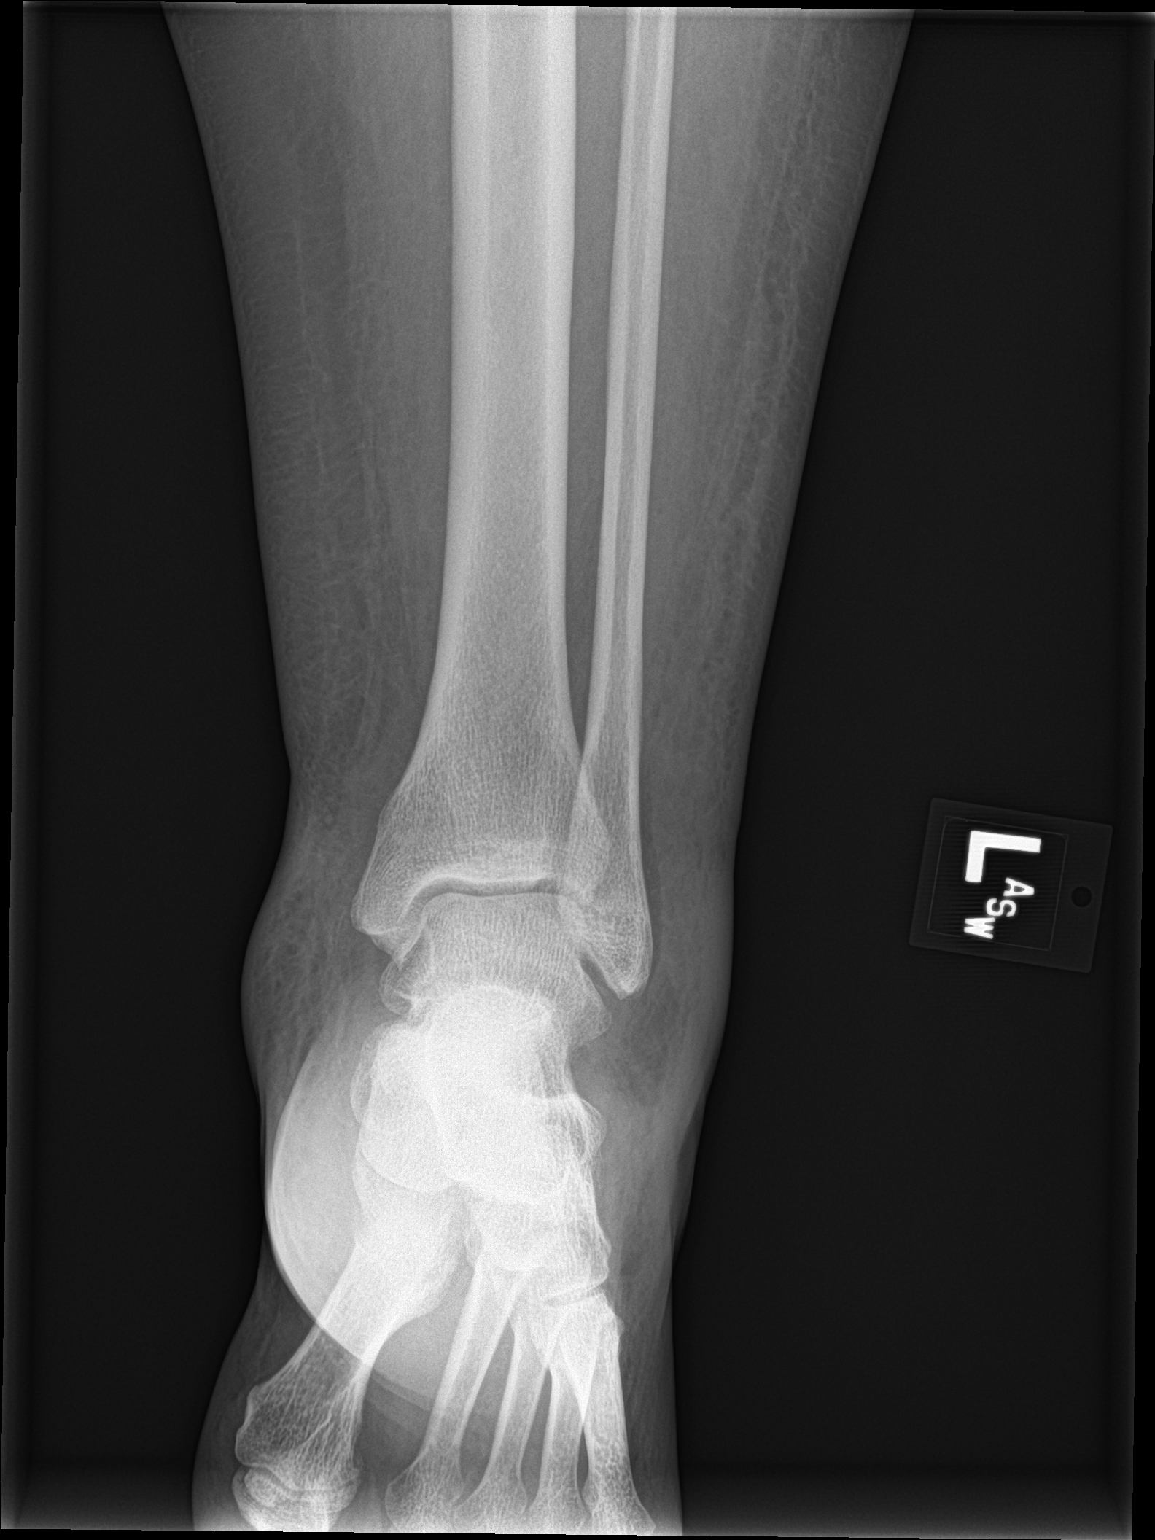

[ankle obl]
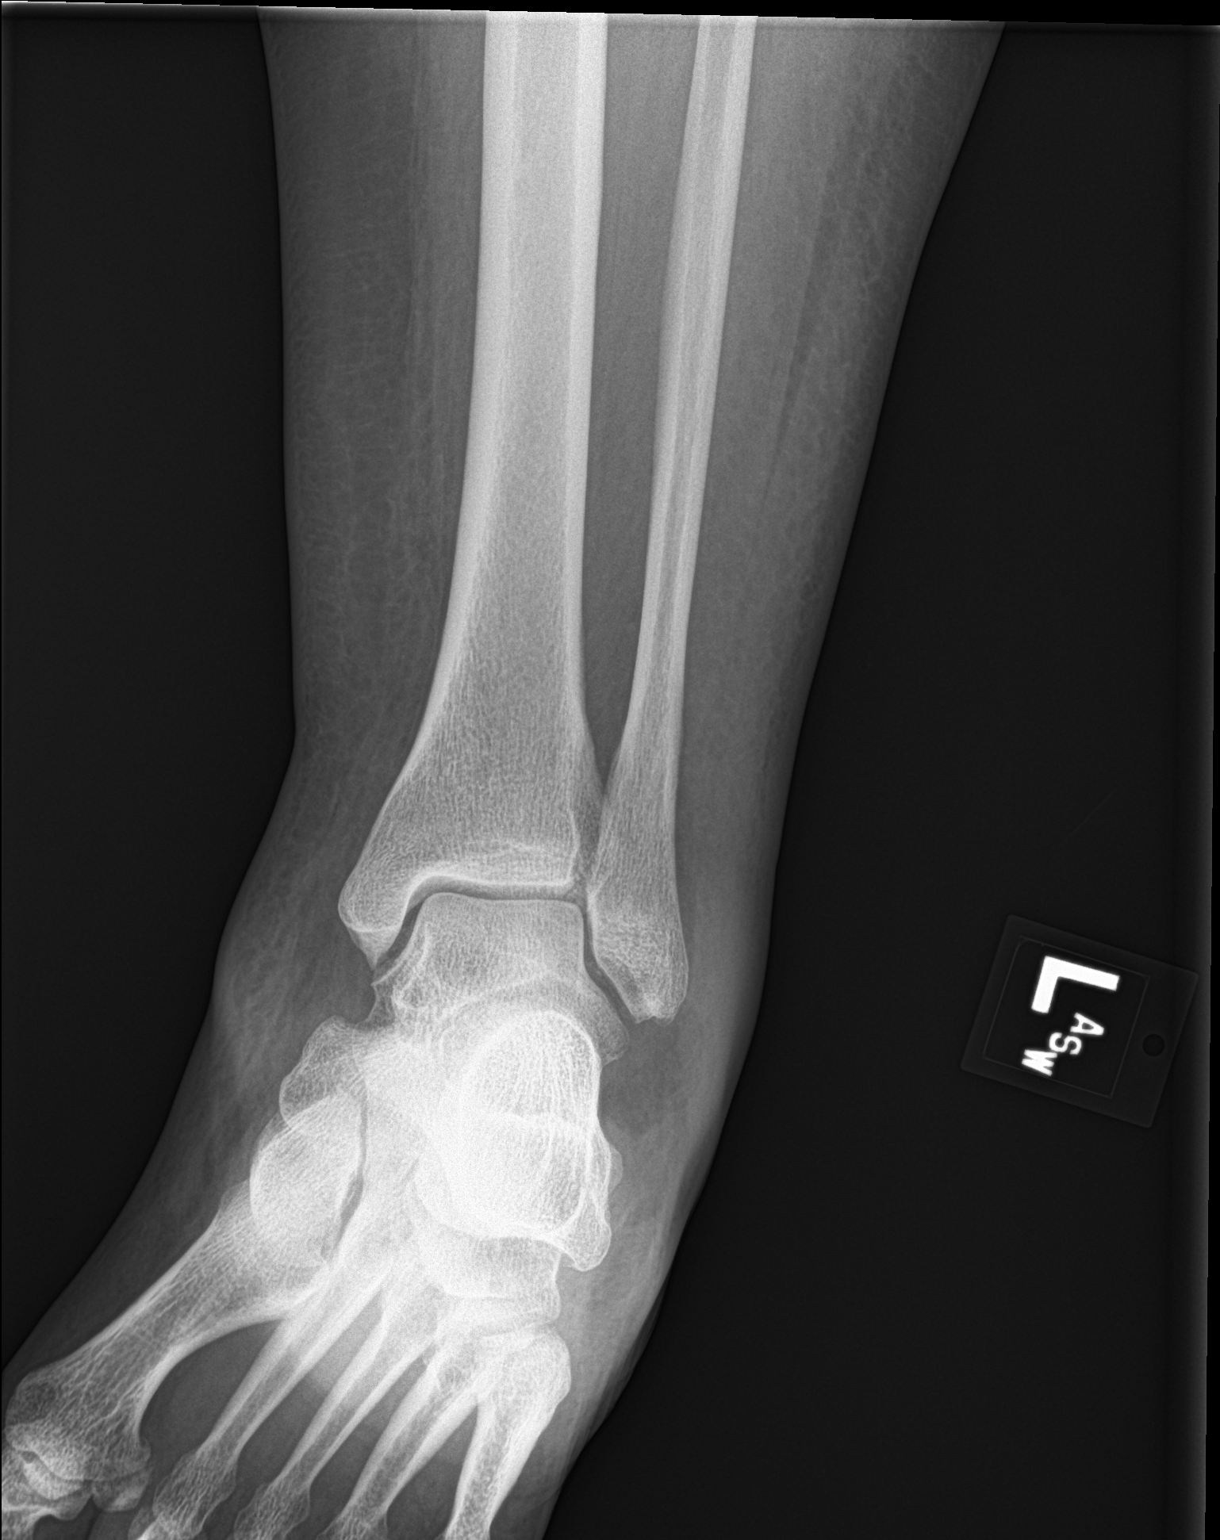

[ankle lat]
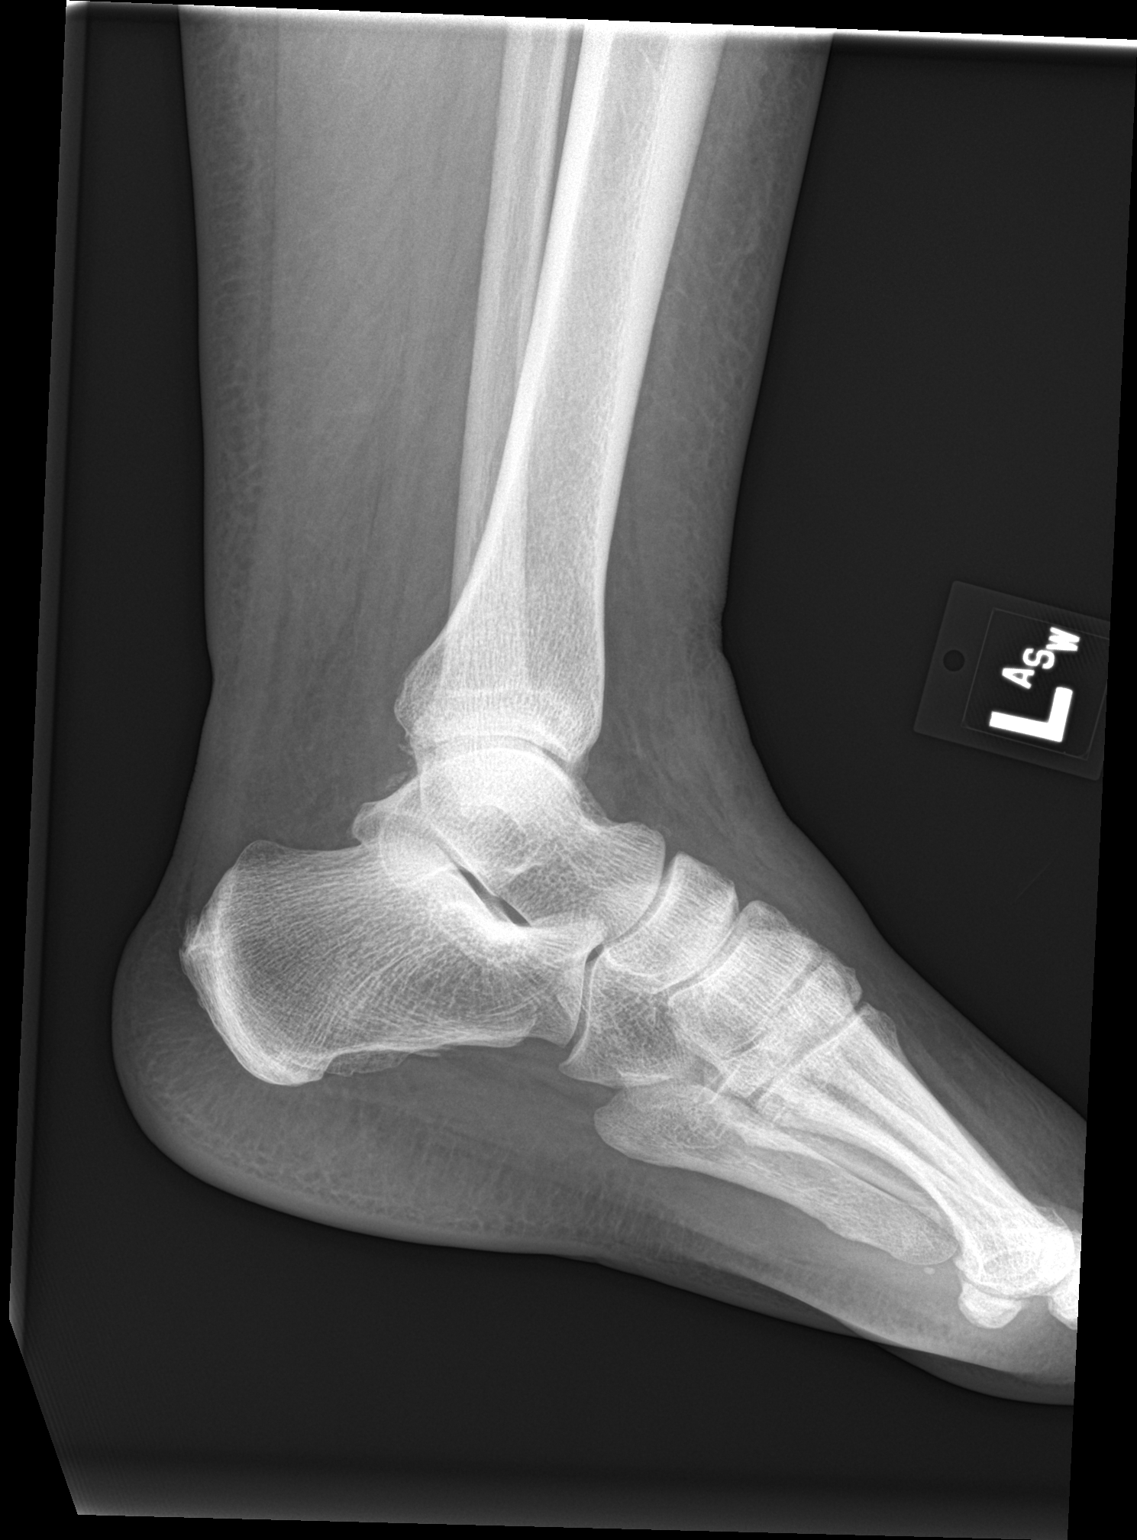

[3 of 3 positions shown; findings below may reference images not displayed]

FINDINGS: There is soft tissue swelling about the ankle without evidence for
an acute displaced fracture or dislocation. There is no evidence for
lytic lesion.
IMPRESSION: Soft tissue swelling without evidence for an acute displaced
fracture or dislocation. No lytic lesion identified.

## 2023-07-08 ENCOUNTER — Inpatient Hospital Stay
Admission: EM | Admit: 2023-07-08 | Discharge: 2023-07-12 | DRG: 101 | Disposition: A | Discharge status: Other Acute Inpatient Hospital | Attending: Internal Medicine | Admitting: Internal Medicine

## 2023-07-08 ENCOUNTER — Other Ambulatory Visit: Payer: Self-pay

## 2023-07-08 ENCOUNTER — Emergency Department

## 2023-07-08 DIAGNOSIS — Z885 Allergy status to narcotic agent status: Secondary | ICD-10-CM

## 2023-07-08 DIAGNOSIS — E669 Obesity, unspecified: Secondary | ICD-10-CM | POA: Diagnosis present

## 2023-07-08 DIAGNOSIS — Z7902 Long term (current) use of antithrombotics/antiplatelets: Secondary | ICD-10-CM

## 2023-07-08 DIAGNOSIS — I480 Paroxysmal atrial fibrillation: Secondary | ICD-10-CM | POA: Diagnosis present

## 2023-07-08 DIAGNOSIS — C9001 Multiple myeloma in remission: Secondary | ICD-10-CM | POA: Diagnosis present

## 2023-07-08 DIAGNOSIS — Z9221 Personal history of antineoplastic chemotherapy: Secondary | ICD-10-CM

## 2023-07-08 DIAGNOSIS — G629 Polyneuropathy, unspecified: Secondary | ICD-10-CM | POA: Diagnosis present

## 2023-07-08 DIAGNOSIS — R569 Unspecified convulsions: Secondary | ICD-10-CM | POA: Diagnosis not present

## 2023-07-08 DIAGNOSIS — G4700 Insomnia, unspecified: Secondary | ICD-10-CM | POA: Diagnosis present

## 2023-07-08 DIAGNOSIS — Z79899 Other long term (current) drug therapy: Secondary | ICD-10-CM

## 2023-07-08 DIAGNOSIS — K649 Unspecified hemorrhoids: Secondary | ICD-10-CM | POA: Diagnosis present

## 2023-07-08 DIAGNOSIS — R55 Syncope and collapse: Secondary | ICD-10-CM

## 2023-07-08 DIAGNOSIS — D630 Anemia in neoplastic disease: Secondary | ICD-10-CM | POA: Diagnosis present

## 2023-07-08 DIAGNOSIS — G40409 Other generalized epilepsy and epileptic syndromes, not intractable, without status epilepticus: Principal | ICD-10-CM | POA: Diagnosis present

## 2023-07-08 DIAGNOSIS — I639 Cerebral infarction, unspecified: Secondary | ICD-10-CM

## 2023-07-08 DIAGNOSIS — Z886 Allergy status to analgesic agent status: Secondary | ICD-10-CM

## 2023-07-08 DIAGNOSIS — Z6835 Body mass index (BMI) 35.0-35.9, adult: Secondary | ICD-10-CM

## 2023-07-08 DIAGNOSIS — I5042 Chronic combined systolic (congestive) and diastolic (congestive) heart failure: Secondary | ICD-10-CM | POA: Diagnosis present

## 2023-07-08 DIAGNOSIS — Z90710 Acquired absence of both cervix and uterus: Secondary | ICD-10-CM

## 2023-07-08 DIAGNOSIS — Z8249 Family history of ischemic heart disease and other diseases of the circulatory system: Secondary | ICD-10-CM

## 2023-07-08 DIAGNOSIS — E872 Acidosis, unspecified: Secondary | ICD-10-CM | POA: Diagnosis present

## 2023-07-08 DIAGNOSIS — Z95810 Presence of automatic (implantable) cardiac defibrillator: Secondary | ICD-10-CM

## 2023-07-08 DIAGNOSIS — Z9484 Stem cells transplant status: Secondary | ICD-10-CM

## 2023-07-08 LAB — URINALYSIS, ROUTINE W REFLEX MICROSCOPIC
Bacteria, UA: NONE SEEN
Bilirubin Urine: NEGATIVE
Glucose, UA: >=500 mg/dL — AB
Ketones, ur: NEGATIVE mg/dL
Leukocytes,Ua: NEGATIVE
Nitrite: NEGATIVE
Protein, ur: NEGATIVE mg/dL
Specific Gravity, Urine: 1.026 (ref 1.005–1.030)
pH: 5 (ref 5.0–8.0)

## 2023-07-08 LAB — COMPREHENSIVE METABOLIC PANEL WITH GFR
ALT: 10 U/L (ref 0–44)
AST: 29 U/L (ref 15–41)
Albumin: 3.2 g/dL — ABNORMAL LOW (ref 3.5–5.0)
Alkaline Phosphatase: 53 U/L (ref 38–126)
Anion gap: 14 (ref 5–15)
BUN: 17 mg/dL (ref 6–20)
CO2: 18 mmol/L — ABNORMAL LOW (ref 22–32)
Calcium: 8.9 mg/dL (ref 8.9–10.3)
Chloride: 105 mmol/L (ref 98–111)
Creatinine, Ser: 1.24 mg/dL — ABNORMAL HIGH (ref 0.44–1.00)
GFR, Estimated: 50 mL/min — ABNORMAL LOW (ref >60)
Glucose, Bld: 143 mg/dL — ABNORMAL HIGH (ref 70–99)
Potassium: 3.8 mmol/L (ref 3.5–5.1)
Sodium: 137 mmol/L (ref 135–145)
Total Bilirubin: 0.9 mg/dL (ref 0.0–1.2)
Total Protein: 6.6 g/dL (ref 6.5–8.1)

## 2023-07-08 LAB — URINE DRUG SCREEN, QUALITATIVE (ARMC ONLY)
Amphetamines, Ur Screen: NOT DETECTED
Barbiturates, Ur Screen: NOT DETECTED
Benzodiazepine, Ur Scrn: NOT DETECTED
Cannabinoid 50 Ng, Ur ~~LOC~~: NOT DETECTED
Cocaine Metabolite,Ur ~~LOC~~: NOT DETECTED
MDMA (Ecstasy)Ur Screen: NOT DETECTED
Methadone Scn, Ur: NOT DETECTED
Opiate, Ur Screen: NOT DETECTED
Phencyclidine (PCP) Ur S: NOT DETECTED
Tricyclic, Ur Screen: POSITIVE — AB

## 2023-07-08 LAB — CBC WITH DIFFERENTIAL/PLATELET
Abs Immature Granulocytes: 0.03 10*3/uL (ref 0.00–0.07)
Basophils Absolute: 0 10*3/uL (ref 0.0–0.1)
Basophils Relative: 0 %
Eosinophils Absolute: 0 10*3/uL (ref 0.0–0.5)
Eosinophils Relative: 0 %
HCT: 35.1 % — ABNORMAL LOW (ref 36.0–46.0)
Hemoglobin: 10.9 g/dL — ABNORMAL LOW (ref 12.0–15.0)
Immature Granulocytes: 0 %
Lymphocytes Relative: 33 %
Lymphs Abs: 2.4 10*3/uL (ref 0.7–4.0)
MCH: 30.1 pg (ref 26.0–34.0)
MCHC: 31.1 g/dL (ref 30.0–36.0)
MCV: 97 fL (ref 80.0–100.0)
Monocytes Absolute: 0.3 10*3/uL (ref 0.1–1.0)
Monocytes Relative: 4 %
Neutro Abs: 4.6 10*3/uL (ref 1.7–7.7)
Neutrophils Relative %: 63 %
Platelets: 215 10*3/uL (ref 150–400)
RBC: 3.62 MIL/uL — ABNORMAL LOW (ref 3.87–5.11)
RDW: 15.6 % — ABNORMAL HIGH (ref 11.5–15.5)
WBC: 7.3 10*3/uL (ref 4.0–10.5)
nRBC: 0 % (ref 0.0–0.2)

## 2023-07-08 LAB — TROPONIN I (HIGH SENSITIVITY)
Troponin I (High Sensitivity): 6 ng/L (ref <18)
Troponin I (High Sensitivity): 8 ng/L (ref <18)

## 2023-07-08 LAB — BRAIN NATRIURETIC PEPTIDE: B Natriuretic Peptide: 135.4 pg/mL — ABNORMAL HIGH (ref 0.0–100.0)

## 2023-07-08 LAB — LACTIC ACID, PLASMA
Lactic Acid, Venous: 3.8 mmol/L (ref 0.5–1.9)
Lactic Acid, Venous: 1.9 mmol/L (ref 0.5–1.9)

## 2023-07-08 MED ORDER — HYDROCODONE-ACETAMINOPHEN 5-325 MG PO TABS
1.0000 | ORAL_TABLET | Freq: Four times a day (QID) | ORAL | Status: DC | PRN
  Administered 2023-07-08 – 2023-07-11 (×2): 1 via ORAL
  Filled 2023-07-08 (×2): qty 1

## 2023-07-08 MED ORDER — ENOXAPARIN SODIUM 60 MG/0.6ML IJ SOSY
0.5000 mg/kg | PREFILLED_SYRINGE | INTRAMUSCULAR | Status: DC
  Administered 2023-07-09 – 2023-07-11 (×4): 45 mg via SUBCUTANEOUS
  Filled 2023-07-08 (×5): qty 0.6

## 2023-07-08 MED ORDER — ENOXAPARIN SODIUM 40 MG/0.4ML IJ SOSY: 40.0000 mg | PREFILLED_SYRINGE | INTRAMUSCULAR | Status: DC

## 2023-07-08 MED ORDER — FUROSEMIDE 20 MG PO TABS
20.0000 mg | ORAL_TABLET | Freq: Every day | ORAL | Status: DC
  Administered 2023-07-12: 20 mg via ORAL
  Filled 2023-07-08: qty 1

## 2023-07-08 MED ORDER — CLOPIDOGREL BISULFATE 75 MG PO TABS: 75.0000 mg | ORAL_TABLET | Freq: Every day | ORAL | Status: DC

## 2023-07-08 MED ORDER — GABAPENTIN 100 MG PO CAPS
100.0000 mg | ORAL_CAPSULE | Freq: Three times a day (TID) | ORAL | Status: DC
  Administered 2023-07-08 – 2023-07-12 (×11): 100 mg via ORAL
  Filled 2023-07-08 (×11): qty 1

## 2023-07-08 MED ORDER — LENALIDOMIDE 15 MG PO CAPS: 15.0000 mg | ORAL_CAPSULE | Freq: Every day | ORAL | Status: DC

## 2023-07-08 MED ORDER — HYDRALAZINE HCL 20 MG/ML IJ SOLN: 10.0000 mg | Freq: Four times a day (QID) | INTRAMUSCULAR | Status: DC | PRN

## 2023-07-08 MED ORDER — CARVEDILOL 6.25 MG PO TABS: 6.2500 mg | ORAL_TABLET | Freq: Two times a day (BID) | ORAL | Status: DC

## 2023-07-08 MED ORDER — SODIUM CHLORIDE 0.9 % IV BOLUS
1000.0000 mL | Freq: Once | INTRAVENOUS | Status: AC
Start: 2023-07-08 — End: 2023-07-08
  Administered 2023-07-08: 1000 mL via INTRAVENOUS

## 2023-07-08 MED ORDER — BISACODYL 5 MG PO TBEC
5.0000 mg | DELAYED_RELEASE_TABLET | Freq: Every day | ORAL | Status: DC | PRN
Start: 2023-07-08 — End: 2023-07-12
  Filled 2023-07-08: qty 1

## 2023-07-08 MED ORDER — ACETAMINOPHEN 325 MG PO TABS
650.0000 mg | ORAL_TABLET | Freq: Once | ORAL | Status: AC
  Administered 2023-07-08: 650 mg via ORAL
  Filled 2023-07-08: qty 2

## 2023-07-08 MED ORDER — AMITRIPTYLINE HCL 25 MG PO TABS
25.0000 mg | ORAL_TABLET | Freq: Every day | ORAL | Status: DC
  Administered 2023-07-08 – 2023-07-11 (×4): 25 mg via ORAL
  Filled 2023-07-08 (×4): qty 1

## 2023-07-08 MED ORDER — EMPAGLIFLOZIN 10 MG PO TABS
10.0000 mg | ORAL_TABLET | Freq: Every day | ORAL | Status: DC
  Administered 2023-07-09 – 2023-07-12 (×4): 10 mg via ORAL
  Filled 2023-07-08 (×4): qty 1

## 2023-07-08 MED ORDER — LEVETIRACETAM (KEPPRA) 500 MG/5 ML ADULT IV PUSH
1500.0000 mg | Freq: Once | INTRAVENOUS | Status: AC
  Administered 2023-07-08: 1500 mg via INTRAVENOUS
  Filled 2023-07-08: qty 15

## 2023-07-08 MED ORDER — AMIODARONE HCL 200 MG PO TABS: 200.0000 mg | ORAL_TABLET | Freq: Two times a day (BID) | ORAL | Status: DC

## 2023-07-08 MED ORDER — ASPIRIN 81 MG PO TBEC
81.0000 mg | DELAYED_RELEASE_TABLET | Freq: Every day | ORAL | Status: DC
  Administered 2023-07-09 – 2023-07-12 (×4): 81 mg via ORAL
  Filled 2023-07-08 (×4): qty 1

## 2023-07-08 MED ORDER — ALBUTEROL SULFATE (2.5 MG/3ML) 0.083% IN NEBU: 2.5000 mg | INHALATION_SOLUTION | Freq: Four times a day (QID) | RESPIRATORY_TRACT | Status: DC | PRN

## 2023-07-08 NOTE — ED Triage Notes (Signed)
 Pt to ED via [facility] from [facility] for c/o witnessed seizure lasting about 5 minutes. Pt at hair appointment with cosmetology dept. Pt confused and post ictal on EMS arrival. Vital signs WNL.

## 2023-07-08 NOTE — Progress Notes (Signed)
 Prior-To-Admission Oral Chemotherapy for Treatment of Oncologic Disease   Order noted from Dr. Gwynneth Lessen to continue prior-to-admission oral chemotherapy regimen of lenalidomide 10mg  daily.  Procedure Per Pharmacy & Therapeutics Committee Policy: Orders for continuation of home oral chemotherapy for treatment of an oncologic disease will be held unless approved by an oncologist during current admission.    For patients receiving oncology care at [facility], inpatient pharmacist contacts patient's oncologist during regular office hours to review. If earlier review is medically necessary, attending physician consults [facility] on-call oncologist    For patients receiving oncology care outside of [facility], attending physician consults patient's oncologist to review. If this oncologist or their coverage cannot be reached, attending physician consults [facility] on-call oncologist   Oral chemotherapy continuation order is on hold pending oncologist review, Non-[facility] oncologist Sascha Tuchmann from [facility] provides oncology care and should be consulted by attending physician    Will M. Alva Jewels, PharmD Clinical Pharmacist 07/08/2023 5:28 PM

## 2023-07-08 NOTE — Progress Notes (Signed)
 PHARMACIST - PHYSICIAN COMMUNICATION  CONCERNING:  Enoxaparin (Lovenox) for DVT Prophylaxis    RECOMMENDATION: Patient was prescribed enoxaprin 40mg  q24 hours for VTE prophylaxis.   Filed Weights   07/08/23 1752  Weight: 91.3 kg (201 lb 4.5 oz)    Body mass index is 35.66 kg/m.  Estimated Creatinine Clearance: 53.1 mL/min (A) (by C-G formula based on SCr of 1.24 mg/dL (H)).   Based on [facility] policy patient is candidate for enoxaparin 0.5mg /kg TBW SQ every 24 hours based on BMI being >30.   DESCRIPTION: Pharmacy has adjusted enoxaparin dose per [facility] policy.  Patient is now receiving enoxaparin 45 mg every 24 hours   Malone Sear, PharmD, BCPS Clinical Pharmacist 07/08/2023 6:02 PM

## 2023-07-08 NOTE — ED Notes (Signed)
 Siadecki, MD, made aware of lactic 3.8

## 2023-07-08 NOTE — ED Provider Notes (Signed)
 [facility] Provider Note    Event Date/Time   First MD Initiated Contact with Patient 07/08/23 1207     (approximate)   History   Seizures   HPI  Carly Randall is a 58 y.o. female with a history of CHF, paroxysmal atrial fibrillation status post ablation, multiple myeloma in remission, AICD, who presents with possible seizure and altered mental status.  Per the friend, the patient was at a salon getting her hair done.  She stated that she wanted some water.  Subsequently she went into a seizure-like episode in which she was unresponsive but her eyes were open.  She was convulsing.  She was laid down on a mat on the floor.  This episode lasted about 5 minutes.  When EMS arrived, she appeared confused and postictal.  The patient is still nonverbal and unable to give any history.  Per EMS and the bystander, there was no trauma.  The friend states that the patient just stated she was thirsty but was not complaining of any other symptoms prior to this episode.  I reviewed the past medical records but the patient's most recent outpatient counter was on 5/22 with cardiology at [facility] for follow-up of her chronic conditions.  I do not see any prior record of a seizure disorder.   Physical Exam   Triage Vital Signs: ED Triage Vitals  Encounter Vitals Group     BP 07/08/23 1206 103/68     Systolic BP Percentile --      Diastolic BP Percentile --      Pulse Rate 07/08/23 1206 100     Resp 07/08/23 1206 (!) 24     Temp 07/08/23 1206 97.8 F (36.6 C)     Temp Source 07/08/23 1206 Oral     SpO2 07/08/23 1206 98 %     Weight --      Height --      Head Circumference --      Peak Flow --      Pain Score 07/08/23 1207 0     Pain Loc --      Pain Education --      Exclude from Growth Chart --     Most recent vital signs: Vitals:   07/08/23 1206  BP: 103/68  Pulse: 100  Resp: (!) 24  Temp: 97.8 F (36.6 C)  SpO2: 98%     General: Alert, nonverbal,  no distress.  CV:  Good peripheral perfusion.  Resp:  Normal effort.  Lungs CTAB. Abd:  No distention.  Soft and nontender. Other:  EOMI.  PERRLA.  No photophobia.  No facial droop.  Motor intact in all extremities; purposeful movements to painful stimuli.   ED Results / Procedures / Treatments   Labs (all labs ordered are listed, but only abnormal results are displayed) Labs Reviewed  COMPREHENSIVE METABOLIC PANEL WITH GFR - Abnormal; Notable for the following components:      Result Value   CO2 18 (*)    Glucose, Bld 143 (*)    Creatinine, Ser 1.24 (*)    Albumin 3.2 (*)    GFR, Estimated 50 (*)    All other components within normal limits  CBC WITH DIFFERENTIAL/PLATELET - Abnormal; Notable for the following components:   RBC 3.62 (*)    Hemoglobin 10.9 (*)    HCT 35.1 (*)    RDW 15.6 (*)    All other components within normal limits  BRAIN NATRIURETIC PEPTIDE - Abnormal;  Notable for the following components:   B Natriuretic Peptide 135.4 (*)    All other components within normal limits  LACTIC ACID, PLASMA - Abnormal; Notable for the following components:   Lactic Acid, Venous 3.8 (*)    All other components within normal limits  LACTIC ACID, PLASMA  URINE DRUG SCREEN, QUALITATIVE ([facility] ONLY)  URINALYSIS, ROUTINE W REFLEX MICROSCOPIC  TROPONIN I (HIGH SENSITIVITY)  TROPONIN I (HIGH SENSITIVITY)     EKG  ED ECG REPORT I, Lind Repine, the attending physician, personally viewed and interpreted this ECG.  Date: 07/08/2023 EKG Time: 1203 Rate: 90 Rhythm: normal sinus rhythm QRS Axis: normal Intervals: Borderline prolonged QTc ST/T Wave abnormalities: normal Narrative Interpretation: no evidence of acute ischemia    RADIOLOGY  CT head: I independently viewed and interpreted the images; there is no ICH.  Radiology report indicates no acute abnormality.   PROCEDURES:  Critical Care performed: No  Procedures   MEDICATIONS ORDERED IN ED: Medications   levETIRAcetam (KEPPRA) undiluted injection 1,500 mg (1,500 mg Intravenous Given 07/08/23 1222)  acetaminophen  (TYLENOL ) tablet 650 mg (650 mg Oral Given 07/08/23 1338)  sodium chloride 0.9 % bolus 1,000 mL (1,000 mLs Intravenous New Bag/Given 07/08/23 1350)     IMPRESSION / MDM / ASSESSMENT AND PLAN / ED COURSE  I reviewed the triage vital signs and the nursing notes.  58 year old female with PMH as noted above presents with altered mental status after an episode of seizure-like activity.  On exam the patient is awake, lying in the stretcher with her eyes open and looking around.  She follows some commands, such as when I tell her to look in a certain direction, but is not squeezing her hands.  However, she moves all extremities purposefully and symmetrically to painful stimuli.  There are no focal neurologic deficits although exam is limited due to her mental status.  Differential diagnosis includes, but is not limited to, epileptic seizure with postictal state, nonepileptic or psychogenic seizure, syncope with concomitant seizure-like activity, electrolyte abnormality, hyperglycemia, uremia, other metabolic disturbance, CVA/TIA, less likely cardiac etiology.  There is no evidence of nonconvulsive status epilepticus given the current neuro exam and the purposeful movements to some commands and to painful stimuli.  We will obtain CT head, lab workup, and reassess.  Patient's presentation is most consistent with acute presentation with potential threat to life or bodily function.  The patient is on the cardiac monitor to evaluate for evidence of arrhythmia and/or significant heart rate changes.  ----------------------------------------- 1:34 PM on 07/08/2023 -----------------------------------------  Patient is now verbal and speaking normally.  She reports a headache but denies other acute symptoms.  ----------------------------------------- 3:21 PM on  07/08/2023 -----------------------------------------  CT head is negative for acute findings.  Labs are significant for lactate of 3.8 which is not consistent with sepsis and likely due to her possible seizure.  CMP and CBC show no acute findings.  Troponin is negative.  Based on discussion with the patient, and given her cardiac history, she is at high risk for a cardiac event.  We will admit her for further monitoring.  I have also asked the nurse to arrange for interrogation of her AICD.  I consulted Dr. Gwynneth Lessen from the hospitalist service; based on our discussion he agrees to evaluate the patient for admission.   FINAL CLINICAL IMPRESSION(S) / ED DIAGNOSES   Final diagnoses:  Seizure-like activity (HCC)  Syncope, unspecified syncope type     Rx / DC Orders   ED Discharge Orders  None        Note:  This document was prepared using Dragon voice recognition software and may include unintentional dictation errors.    Lind Repine, MD 07/08/23 [phone]

## 2023-07-08 NOTE — Plan of Care (Signed)
  Problem: Education: Goal: Knowledge of General Education information will improve Description: Including pain rating scale, medication(s)/side effects and non-pharmacologic comfort measures Outcome: Progressing   Problem: Health Behavior/Discharge Planning: Goal: Ability to manage health-related needs will improve Outcome: Progressing   Problem: Activity: Goal: Risk for activity intolerance will decrease Outcome: Progressing   Problem: Nutrition: Goal: Adequate nutrition will be maintained Outcome: Progressing   Problem: Coping: Goal: Level of anxiety will decrease Outcome: Progressing   Problem: Elimination: Goal: Will not experience complications related to bowel motility Outcome: Progressing   Problem: Safety: Goal: Ability to remain free from injury will improve Outcome: Progressing   Problem: Skin Integrity: Goal: Risk for impaired skin integrity will decrease Outcome: Progressing   

## 2023-07-08 NOTE — H&P (Signed)
 [facility] History and Physical  Carly Randall WUJ:[id number] DOB: [birth year] DOA: 07/08/2023 PCP: Alexander Anes, MD  Presented from: Home Chief Complaint: Seizure-like activity  History of Present Illness: Carly Randall is a 58 y.o. female with PMH significant for systolic CHF - recovered EF s/p AICD, paroxysmal A-fib status post ablation not on anticoagulation, multiple myeloma Patient was brought to the ED today after a witnessed seizure. Patient was at a salon getting her head done.  Suddenly without any premonition symptoms, she had a seizure-like episode witnessed by her friend.  She evidently went unresponsive with eyes rolled, foaming coming out of the mouth and started shaking bad.  She was laid down on the mat.  After few minutes, she opened her eyes, looked confused.  EMS noted her confusion postictal.  She was not able to verbalize anything to them. Patient does not remember any of the event. No prior history of seizure.  In the ED, patient was afebrile, heart rate 100, blood pressure 103/68, breathing on room air. Labs showed WBC 7.3, hemoglobin 10.9, BUN/creatinine 17/1.24, lactic acid 3.8 Urinalysis showed hazy yellow color urine with no bacteria CT head unremarkable  In the ED, her mental status gradually improved in next 1 to 2 hours.  She did not have any focal deficits. Pacemaker interrogation was requested. She was also given loading dose of IV Keppra 1500 mg. Hospitalist service was consulted for inpatient management.  Review of Systems:  All systems were reviewed and were negative unless otherwise mentioned in the HPI   Past medical history: Past Medical History:  Diagnosis Date   Cancer (HCC) 11/2011   multiple myeloma   Congestive heart failure (CHF) (HCC)     Past surgical history: Past Surgical History:  Procedure Laterality Date   ABDOMINAL HYSTERECTOMY     BREAST BIOPSY Right 2011   neg   HAND SURGERY      Social  History:  reports that she has never smoked. She has never used smokeless tobacco. She reports that she does not drink alcohol and does not use drugs.  Allergies:  Allergies  Allergen Reactions   Filgrastim Other (See Comments)    Other reaction(s): Other (See Comments) Other reaction(s): Other (See Comments) Severe pain Severe pain Severe pain    Pegfilgrastim     Other reaction(s): Other (See Comments) Severe bone pain Other reaction(s): Other (See Comments) Severe bone pain    Percocet [Oxycodone-Acetaminophen ] Itching   Codeine Itching   Naproxen Itching   Filgrastim, Pegfilgrastim, Percocet [oxycodone-acetaminophen ], Codeine, and Naproxen   Family history:  Family History  Problem Relation Age of Onset   Hyperthyroidism Mother    Heart disease Father      Physical Exam: Vitals:   07/08/23 1206 07/08/23 1619  BP: 103/68 (!) 104/52  Pulse: 100 68  Resp: (!) 24 16  Temp: 97.8 F (36.6 C) (!) 97.5 F (36.4 C)  TempSrc: Oral Oral  SpO2: 98% 100%   Wt Readings from Last 3 Encounters:  12/28/18 122.5 kg  10/25/18 115.7 kg  10/14/15 115.7 kg   There is no height or weight on file to calculate BMI.  General exam: Pleasant, middle-aged African-American female Skin: No rashes, lesions or ulcers. HEENT: Atraumatic, normocephalic, no obvious bleeding.  No evidence of tongue bite Lungs: Clear to auscultation bilaterally,  CVS: S1, S2, no murmur,   GI/Abd: Soft, nontender, nondistended, bowel sound present,   CNS: Alert, awake, oriented x 3 Psychiatry: Mood appropriate,  Extremities: No pedal  edema, no calf tenderness,     ----------------------------------------------------------------------------------------------------------------------------------------- ----------------------------------------------------------------------------------------------------------------------------------------- -----------------------------------------------------------------------------------------------------------------------------------------  Assessment/Plan: Principal Problem:   Seizure (HCC)  Seizure-like activity Presented after a witnessed GTCS with eyes rolling, mouth foaming, lactic acidosis on lab No premonition symptoms.   No prior history of seizure.   Has history of CHF with recovered EF Per history, it sounds like true seizure. In the ED, patient was given 1 dose of Keppra 1500 mg IV Obtain EEG. If EEG is positive, consider reaching out to neurology for formal consultation.  Otherwise, may not need AED for first-time seizure Seizure precautions Repeat lactic acid level in the morning Recent Labs  Lab 07/08/23 1208 07/08/23 1320 07/08/23 1544  WBC 7.3  --   --   LATICACIDVEN  --  3.8* 1.9   H/o multiple myeloma Currently in remission.  Continue leflunomide. CT had unremarkable for any intracranial metastasis. Follows up at [facility]  H/o systolic CHF Per patient, it was secondary to chemo- recovered EF s/p AICD Echo from March 2025 with EF 44%. Previously on Entresto.  Current meds Coreg, Jardiance, Lasix Continue.  paroxysmal A-fib s/p prior ablation Continue amiodarone Not on chronic anticoagulation.  Neuropathy Gabapentin 3 times daily, Elavil, Norco as needed  Aspirin, Plavix  Home med list has not been obtained/updated by [facility] at the time of admission.  Please double check admission reconciliation.   Mobility: Encourage ambulation  Goals of care:   Code Status: Full Code    DVT prophylaxis:  enoxaparin (LOVENOX) injection 40 mg Start: 07/08/23 1600   Antimicrobials:  None Fluid: None Consultants: Consult for Family Communication: Family at bedside  Status: Observation Level of care:  Telemetry Medical   Patient is from: Home Anticipated d/c to: Pending clinical course.  Hopefully home tomorrow  Diet:  Diet Order             Diet Heart Room service appropriate? Yes; Fluid consistency: Thin  Diet effective now                    ------------------------------------------------------------------------------------- Severity of Illness: The appropriate patient status for this patient is OBSERVATION. Observation status is judged to be reasonable and necessary in order to provide the required intensity of service to ensure the patient's safety. The patient's presenting symptoms, physical exam findings, and initial radiographic and laboratory data in the context of their medical condition is felt to place them at decreased risk for further clinical deterioration. Furthermore, it is anticipated that the patient will be medically stable for discharge from the hospital within 2 midnights of admission.  -------------------------------------------------------------------------------------   Home Meds: Prior to Admission medications   Medication Sig Start Date End Date Taking? Authorizing Provider  amitriptyline (ELAVIL) 25 MG tablet Take by mouth. 07/19/17   [provider]  aspirin EC 81 MG tablet Take by mouth.    [provider]  carvedilol (COREG) 6.25 MG tablet TK 1 T PO BID 10/15/18   [provider]  clopidogrel (PLAVIX) 75 MG tablet Take by mouth. 10/07/18   [provider]  furosemide (LASIX) 20 MG tablet TAKE ONE TABLET BY MOUTH ONCE DAILY 08/18/16   [provider]  gabapentin (NEURONTIN) 100 MG capsule Take 100 mg by mouth 3 (three) times daily.    [provider]  gabapentin (NEURONTIN) 300 MG capsule Take 300 mg by mouth 3 (three) times daily.    [provider]   HYDROcodone -acetaminophen  (NORCO/VICODIN) 5-325 MG tablet Take 1-2 tablets by mouth every 6 (six) hours  as needed for moderate pain or severe pain. 12/30/18   Michal Agar, MD  lenalidomide (REVLIMID) 15 MG capsule Take 15 mg by mouth daily.    [provider]  magnesium 30 MG tablet Take 30 mg by mouth 2 (two) times daily.    [provider]  potassium chloride (KLOR-CON) 20 MEQ packet Take by mouth 2 (two) times daily.    [provider]  valACYclovir (VALTREX) 1000 MG tablet Take 1,000 mg by mouth 2 (two) times daily.    [provider]    Labs on Admission:   CBC: Recent Labs  Lab 07/08/23 1208  WBC 7.3  NEUTROABS 4.6  HGB 10.9*  HCT 35.1*  MCV 97.0  PLT 215    Basic Metabolic Panel: Recent Labs  Lab 07/08/23 1208  NA 137  K 3.8  CL 105  CO2 18*  GLUCOSE 143*  BUN 17  CREATININE 1.24*  CALCIUM 8.9    Liver Function Tests: Recent Labs  Lab 07/08/23 1208  AST 29  ALT 10  ALKPHOS 53  BILITOT 0.9  PROT 6.6  ALBUMIN 3.2*   No results for input(s): "LIPASE", "AMYLASE" in the last 168 hours. No results for input(s): "AMMONIA" in the last 168 hours.  Cardiac Enzymes: No results for input(s): "CKTOTAL", "CKMB", "CKMBINDEX", "TROPONINI" in the last 168 hours.  BNP (last 3 results) Recent Labs    07/08/23 1208  BNP 135.4*    ProBNP (last 3 results) No results for input(s): "PROBNP" in the last 8760 hours.  CBG: No results for input(s): "GLUCAP" in the last 168 hours.  Lipase  No results found for: "LIPASE"   Urinalysis    Component Value Date/Time   COLORURINE YELLOW (A) 07/08/2023 1544   APPEARANCEUR HAZY (A) 07/08/2023 1544   APPEARANCEUR Clear 04/03/2013 1515   LABSPEC 1.026 07/08/2023 1544   LABSPEC 1.016 04/03/2013 1515   PHURINE 5.0 07/08/2023 1544   GLUCOSEU >=500 (A) 07/08/2023 1544   GLUCOSEU Negative 04/03/2013 1515   HGBUR SMALL (A) 07/08/2023 1544   BILIRUBINUR NEGATIVE 07/08/2023 1544    BILIRUBINUR Negative 04/03/2013 1515   KETONESUR NEGATIVE 07/08/2023 1544   PROTEINUR NEGATIVE 07/08/2023 1544   NITRITE NEGATIVE 07/08/2023 1544   LEUKOCYTESUR NEGATIVE 07/08/2023 1544   LEUKOCYTESUR Negative 04/03/2013 1515     Drugs of Abuse  No results found for: "LABOPIA", "COCAINSCRNUR", "LABBENZ", "AMPHETMU", "THCU", "LABBARB"    Radiological Exams on Admission: CT Head Wo Contrast Result Date: 07/08/2023 CLINICAL DATA:  Altered mental status EXAM: CT HEAD WITHOUT CONTRAST TECHNIQUE: Contiguous axial images were obtained from the base of the skull through the vertex without intravenous contrast. RADIATION DOSE REDUCTION: This exam was performed according to the departmental dose-optimization program which includes automated exposure control, adjustment of the mA and/or kV according to patient size and/or use of iterative reconstruction technique. COMPARISON:  06/08/2011 FINDINGS: Brain: No evidence of acute infarction, hemorrhage, hydrocephalus, extra-axial collection or mass lesion/mass effect. Vascular: No hyperdense vessel or unexpected calcification. Skull: Normal. Negative for fracture or focal lesion. Sinuses/Orbits: No acute finding. Other: None. IMPRESSION: No acute intracranial pathology. Electronically Signed   By: Fredricka Jenny M.D.   On: 07/08/2023 14:37     Signed, Hoyt Macleod, MD [facility] 07/08/2023

## 2023-07-09 ENCOUNTER — Observation Stay

## 2023-07-09 DIAGNOSIS — R55 Syncope and collapse: Secondary | ICD-10-CM | POA: Diagnosis present

## 2023-07-09 DIAGNOSIS — Z7902 Long term (current) use of antithrombotics/antiplatelets: Secondary | ICD-10-CM | POA: Diagnosis not present

## 2023-07-09 DIAGNOSIS — G629 Polyneuropathy, unspecified: Secondary | ICD-10-CM | POA: Diagnosis present

## 2023-07-09 DIAGNOSIS — Z8249 Family history of ischemic heart disease and other diseases of the circulatory system: Secondary | ICD-10-CM | POA: Diagnosis not present

## 2023-07-09 DIAGNOSIS — E669 Obesity, unspecified: Secondary | ICD-10-CM | POA: Diagnosis present

## 2023-07-09 DIAGNOSIS — G4700 Insomnia, unspecified: Secondary | ICD-10-CM | POA: Diagnosis present

## 2023-07-09 DIAGNOSIS — Z90710 Acquired absence of both cervix and uterus: Secondary | ICD-10-CM | POA: Diagnosis not present

## 2023-07-09 DIAGNOSIS — Z886 Allergy status to analgesic agent status: Secondary | ICD-10-CM | POA: Diagnosis not present

## 2023-07-09 DIAGNOSIS — I480 Paroxysmal atrial fibrillation: Secondary | ICD-10-CM | POA: Diagnosis present

## 2023-07-09 DIAGNOSIS — Z95810 Presence of automatic (implantable) cardiac defibrillator: Secondary | ICD-10-CM | POA: Diagnosis not present

## 2023-07-09 DIAGNOSIS — Z79899 Other long term (current) drug therapy: Secondary | ICD-10-CM | POA: Diagnosis not present

## 2023-07-09 DIAGNOSIS — R569 Unspecified convulsions: Secondary | ICD-10-CM | POA: Insufficient documentation

## 2023-07-09 DIAGNOSIS — I5042 Chronic combined systolic (congestive) and diastolic (congestive) heart failure: Secondary | ICD-10-CM | POA: Diagnosis present

## 2023-07-09 DIAGNOSIS — I5020 Unspecified systolic (congestive) heart failure: Secondary | ICD-10-CM | POA: Diagnosis not present

## 2023-07-09 DIAGNOSIS — K649 Unspecified hemorrhoids: Secondary | ICD-10-CM | POA: Diagnosis present

## 2023-07-09 DIAGNOSIS — Z7401 Bed confinement status: Secondary | ICD-10-CM | POA: Diagnosis not present

## 2023-07-09 DIAGNOSIS — K529 Noninfective gastroenteritis and colitis, unspecified: Secondary | ICD-10-CM | POA: Diagnosis not present

## 2023-07-09 DIAGNOSIS — R410 Disorientation, unspecified: Secondary | ICD-10-CM

## 2023-07-09 DIAGNOSIS — Z9221 Personal history of antineoplastic chemotherapy: Secondary | ICD-10-CM | POA: Diagnosis not present

## 2023-07-09 DIAGNOSIS — G4089 Other seizures: Secondary | ICD-10-CM | POA: Diagnosis not present

## 2023-07-09 DIAGNOSIS — I4891 Unspecified atrial fibrillation: Secondary | ICD-10-CM | POA: Diagnosis not present

## 2023-07-09 DIAGNOSIS — C9000 Multiple myeloma not having achieved remission: Secondary | ICD-10-CM | POA: Diagnosis not present

## 2023-07-09 DIAGNOSIS — E872 Acidosis, unspecified: Secondary | ICD-10-CM | POA: Diagnosis present

## 2023-07-09 DIAGNOSIS — D630 Anemia in neoplastic disease: Secondary | ICD-10-CM | POA: Diagnosis present

## 2023-07-09 DIAGNOSIS — C9001 Multiple myeloma in remission: Secondary | ICD-10-CM | POA: Diagnosis present

## 2023-07-09 DIAGNOSIS — Z6835 Body mass index (BMI) 35.0-35.9, adult: Secondary | ICD-10-CM | POA: Diagnosis not present

## 2023-07-09 DIAGNOSIS — G40409 Other generalized epilepsy and epileptic syndromes, not intractable, without status epilepticus: Secondary | ICD-10-CM | POA: Diagnosis present

## 2023-07-09 DIAGNOSIS — Z885 Allergy status to narcotic agent status: Secondary | ICD-10-CM | POA: Diagnosis not present

## 2023-07-09 DIAGNOSIS — G6289 Other specified polyneuropathies: Secondary | ICD-10-CM | POA: Diagnosis not present

## 2023-07-09 DIAGNOSIS — Z9484 Stem cells transplant status: Secondary | ICD-10-CM | POA: Diagnosis not present

## 2023-07-09 LAB — BASIC METABOLIC PANEL WITH GFR
Anion gap: 5 (ref 5–15)
BUN: 15 mg/dL (ref 6–20)
CO2: 25 mmol/L (ref 22–32)
Calcium: 8.3 mg/dL — ABNORMAL LOW (ref 8.9–10.3)
Chloride: 112 mmol/L — ABNORMAL HIGH (ref 98–111)
Creatinine, Ser: 1.17 mg/dL — ABNORMAL HIGH (ref 0.44–1.00)
GFR, Estimated: 54 mL/min — ABNORMAL LOW (ref >60)
Glucose, Bld: 91 mg/dL (ref 70–99)
Potassium: 3.5 mmol/L (ref 3.5–5.1)
Sodium: 142 mmol/L (ref 135–145)

## 2023-07-09 LAB — CBC
HCT: 27.6 % — ABNORMAL LOW (ref 36.0–46.0)
Hemoglobin: 8.6 g/dL — ABNORMAL LOW (ref 12.0–15.0)
MCH: 30.4 pg (ref 26.0–34.0)
MCHC: 31.2 g/dL (ref 30.0–36.0)
MCV: 97.5 fL (ref 80.0–100.0)
Platelets: 177 10*3/uL (ref 150–400)
RBC: 2.83 MIL/uL — ABNORMAL LOW (ref 3.87–5.11)
RDW: 15.9 % — ABNORMAL HIGH (ref 11.5–15.5)
WBC: 4.8 10*3/uL (ref 4.0–10.5)
nRBC: 0 % (ref 0.0–0.2)

## 2023-07-09 LAB — HIV ANTIBODY (ROUTINE TESTING W REFLEX): HIV Screen 4th Generation wRfx: NONREACTIVE

## 2023-07-09 LAB — LACTIC ACID, PLASMA: Lactic Acid, Venous: 1.6 mmol/L (ref 0.5–1.9)

## 2023-07-09 MED ORDER — LEVETIRACETAM 500 MG PO TABS
500.0000 mg | ORAL_TABLET | Freq: Two times a day (BID) | ORAL | Status: DC
  Administered 2023-07-10 – 2023-07-11 (×2): 500 mg via ORAL
  Filled 2023-07-09 (×3): qty 1

## 2023-07-09 MED ORDER — LEVETIRACETAM (KEPPRA) 500 MG/5 ML ADULT IV PUSH
2000.0000 mg | Freq: Once | INTRAVENOUS | Status: AC
  Administered 2023-07-09: 2000 mg via INTRAVENOUS
  Filled 2023-07-09: qty 20

## 2023-07-09 MED ORDER — LORAZEPAM 2 MG/ML IJ SOLN
2.0000 mg | INTRAMUSCULAR | Status: DC | PRN
  Administered 2023-07-10: 2 mg via INTRAVENOUS
  Filled 2023-07-09: qty 1

## 2023-07-09 MED ORDER — LORAZEPAM 2 MG/ML IJ SOLN
2.0000 mg | Freq: Once | INTRAMUSCULAR | Status: AC
  Administered 2023-07-09: 2 mg via INTRAVENOUS
  Filled 2023-07-09: qty 1

## 2023-07-09 MED ORDER — LEVETIRACETAM (KEPPRA) 500 MG/5 ML ADULT IV PUSH
4500.0000 mg | Freq: Once | INTRAVENOUS | Status: DC
  Filled 2023-07-09: qty 45

## 2023-07-09 MED ORDER — POTASSIUM CHLORIDE CRYS ER 20 MEQ PO TBCR
40.0000 meq | EXTENDED_RELEASE_TABLET | Freq: Once | ORAL | Status: AC
  Administered 2023-07-09: 40 meq via ORAL
  Filled 2023-07-09: qty 2

## 2023-07-09 MED ORDER — LEVETIRACETAM (KEPPRA) 500 MG/5 ML ADULT IV PUSH
500.0000 mg | Freq: Two times a day (BID) | INTRAVENOUS | Status: DC
  Administered 2023-07-10: 500 mg via INTRAVENOUS
  Filled 2023-07-09 (×3): qty 5

## 2023-07-09 NOTE — Plan of Care (Signed)

## 2023-07-09 NOTE — Procedures (Signed)
 Patient Name: Carly Randall  MRN: [id number]  Epilepsy Attending: Arleene Lack  Referring Physician/Provider: Hoyt Macleod, MD  Date: 07/09/2023 Duration: 31.49mins  Patient history: 58 y.o. female was brought to the ED today after a witnessed seizure. EEG to evaluate for seizure.  Level of alertness: Awake, asleep  AEDs during EEG study: LEV, Ativan,GBP  Technical aspects: This EEG study was done with scalp electrodes positioned according to the 10-20 International system of electrode placement. Electrical activity was reviewed with band pass filter of 1-70Hz , sensitivity of 7 uV/mm, display speed of 28mm/sec with a 60Hz  notched filter applied as appropriate. EEG data were recorded continuously and digitally stored.  Video monitoring was available and reviewed as appropriate.  Description: The posterior dominant rhythm consists of 9 Hz activity of moderate voltage (25-35 uV) seen predominantly in posterior head regions, symmetric and reactive to eye opening and eye closing. Sleep was characterized by vertex waves, sleep spindles (12 to 14 Hz), maximal frontocentral region. Sharp transients were noted in right frontal region. Hyperventilation and photic stimulation were not performed.     IMPRESSION: This study is within normal limits. No seizures were seen throughout the recording.  Sharp transients were noted in right frontal region. Consider long term eeg if concern for ictal-interictal activity persists.  Akya Fiorello O Suriah Peragine

## 2023-07-09 NOTE — Care Management Obs Status (Signed)
 MEDICARE OBSERVATION STATUS NOTIFICATION   Patient Details  Name: Carly Randall MRN: [id number] Date of Birth: [birth year]   Medicare Observation Status Notification Given:       Anise Kerns 07/09/2023, 3:23 PM

## 2023-07-09 NOTE — TOC Initial Note (Signed)
 Transition of Care [facility]) - Initial/Assessment Note    Patient Details  Name: Carly Randall MRN: [id number] Date of Birth: 1965-09-09  [facility]) CM/SW Contact:    Crayton Docker, RN 07/09/2023, 1:11 PM  Clinical Narrative:                  CM to patient's room regarding TOC screening assessment. CM introduced case management role and discharge care planning process. Patient verbalized agreement with screening interview. Per patient lives alone with 1 dog--vaccinated. Per patient has access to cane, walker, wheelchair. Per patient has previous home health experience.  Expected Discharge Plan: Home/Self Care Barriers to Discharge: Continued Medical Work up   Patient Goals and CMS Choice    Home/self care   Expected Discharge Plan and Services      Home/self care   Prior Living Arrangements/Services   Lives with:: Self, Pets (1 dog --vaccinated) Patient language and need for interpreter reviewed:: No Do you feel safe going back to the place where you live?: Yes      Need for Family Participation in Patient Care: Yes (Comment) Care giver support system in place?: Yes (comment) Current home services: DME (cane, walker, wheelchair) Criminal Activity/Legal Involvement Pertinent to Current Situation/Hospitalization: No - Comment as needed  Activities of Daily Living   ADL Screening (condition at time of admission) Independently performs ADLs?: Yes (appropriate for developmental age) Is the patient deaf or have difficulty hearing?: No Does the patient have difficulty seeing, even when wearing glasses/contacts?: No Does the patient have difficulty concentrating, remembering, or making decisions?: No  Permission Sought/Granted Permission sought to share information with : Case Manager, Family Supports Permission granted to share information with : Yes, Verbal Permission Granted  Share Information with NAME: Ayeshia Coppin     Permission granted to  share info w Relationship: Mother  Permission granted to share info w Contact Information: yes  Emotional Assessment Appearance:: Appears stated age Attitude/Demeanor/Rapport: Engaged Affect (typically observed): Calm Orientation: : Oriented to Self, Oriented to Place Alcohol / Substance Use: Alcohol Use (last alcohol use 2-3 weeks ago)    Admission diagnosis:  Seizure (HCC) [R56.9] Seizure-like activity (HCC) [R56.9] Syncope, unspecified syncope type [R55] Patient Active Problem List   Diagnosis Date Noted   Seizure (HCC) 07/08/2023   PCP:  Alexander Anes, MD Pharmacy:   [facility] [address], [place] - [address] [place] [address] Phone: [phone] Fax: [phone]  [id number], [place] - [address] AT [facility] [address] [place] [address] Phone: [phone] Fax: [phone]     Social Drivers of Health (SDOH) Social History: SDOH Screenings   Food Insecurity: No Food Insecurity (07/08/2023)  Housing: Unknown (07/08/2023)  Transportation Needs: No Transportation Needs (07/08/2023)  Utilities: Not At Risk (07/08/2023)  Recent Concern: Utilities - High Risk (05/31/2023)   Received from [facility]  Financial Resource Strain: High Risk (05/31/2023)   Received from [facility]  Physical Activity: Sufficiently Active (07/17/2022)   Received from [facility]  Social Connections: Moderately Isolated (07/17/2022)   Received from [facility]  Stress: No Stress Concern Present (07/17/2022)   Received from [facility]  Tobacco Use: Low Risk  (07/08/2023)  Health Literacy: Low Risk  (10/17/2022)   Received from [facility]   SDOH Interventions:     Readmission Risk  Interventions     No data to display

## 2023-07-09 NOTE — Progress Notes (Signed)
 Progress Note   Patient: Carly Randall ZOX:[id number] DOB: 1965-12-24 DOA: 07/08/2023     0 DOS: the patient was seen and examined on 07/09/2023   Brief hospital course:  Carly Randall is a 58 y.o. female with PMH significant for systolic CHF - recovered EF s/p AICD, paroxysmal A-fib status post ablation not on anticoagulation, multiple myeloma Patient was brought to the ED today after a witnessed seizure. Patient was at a salon getting her head done.  Suddenly without any premonition symptoms, she had a seizure-like episode witnessed by her friend.  She evidently went unresponsive with eyes rolled, foaming coming out of the mouth and started shaking bad.  She was laid down on the mat.  After few minutes, she opened her eyes, looked confused.  EMS noted her confusion postictal.  She was not able to verbalize anything to them. Patient does not remember any of the event. No prior history of seizure.   In the ED, patient was afebrile, heart rate 100, blood pressure 103/68, breathing on room air. Labs showed WBC 7.3, hemoglobin 10.9, BUN/creatinine 17/1.24, lactic acid 3.8 Urinalysis showed hazy yellow color urine with no bacteria CT head unremarkable   In the ED, her mental status gradually improved in next 1 to 2 hours.  She did not have any focal deficits. Pacemaker interrogation was requested. She was also given loading dose of IV Keppra 1500 mg. Hospitalist service was consulted for inpatient management.       Assessment and Plan:  Seizure-like activity Presented after a witnessed GTCS with eyes rolling, mouth foaming, lactic acidosis on lab No premonitory symptoms.   No prior history of seizure.   Per history, it sounds like true seizure, new onset In the ED, patient was given 1 dose of Keppra 1500 mg IV EEG has been done and result is pending Appreciate neurology input As needed lorazepam for seizure    H/o multiple myeloma Currently in remission.   Leflunomide is on hold CT had unremarkable for any intracranial metastasis. Follows up at [facility]   Chronic systolic CHF Per patient, it was secondary to chemo- recovered EF s/p AICD Echo from March 2025 with EF 44%. Previously on Entresto.   Continue Coreg, Jardiance, Lasix    Paroxysmal A-fib s/p prior ablation Continue amiodarone Not on chronic anticoagulation.   Neuropathy Gabapentin 3 times daily, Elavil, Norco as needed      Subjective: Lying in bed.  Appears comfortable and in no distress.  No events overnight  Physical Exam: Vitals:   07/08/23 1752 07/08/23 2354 07/09/23 0359 07/09/23 0816  BP:  (!) 100/52 (!) 93/51 (!) 96/53  Pulse:  67 71 (!) 59  Resp:  18  17  Temp:  98.1 F (36.7 C) 97.9 F (36.6 C) 97.9 F (36.6 C)  TempSrc:   Oral   SpO2:  99% 100% 100%  Weight: 91.3 kg     Height:       General exam: Pleasant, middle-aged African-American female Skin: No rashes, lesions or ulcers. HEENT: Atraumatic, normocephalic, no obvious bleeding.  No evidence of tongue bite Lungs: Clear to auscultation bilaterally,  CVS: S1, S2, no murmur,   GI/Abd: Soft, nontender, nondistended, bowel sound present,   CNS: Alert, awake, oriented x 3 Psychiatry: Mood appropriate,  Extremities: No pedal edema, no calf tenderness,     Data Reviewed: Hemoglobin 8.6, potassium 3.5, lactate 1.6 Labs reviewed  Family Communication: Plan of care discussed with patient at the bedside.  She verbalizes understanding and  agrees with the plan.  Disposition: Status is: Observation The patient remains OBS appropriate and will d/c before 2 midnights.  Planned Discharge Destination: Home    Time spent: 38 minutes  Author: Read Camel, MD 07/09/2023 12:19 PM  For on call review [email or web address].

## 2023-07-09 NOTE — Progress Notes (Signed)
 Patient mom called stating that patient ambulated to the bathroom, upon returning to the bed stated she feel dizzy like she was going to collapse and was assisted back to bed by tech. I went to room and evaluated patient she was able to follow command, but appear to be confused about who her mother was, her name and where she was, she was able to state that today is Friday.  V/S was check see flow sheet. Neurologist walked into the room and she assessed the patient. States to get an ortho static. Patient not able to participate in such at this time because she falling asleep and due to recent episode of dizziness will collect orthostatic when able.

## 2023-07-09 NOTE — Plan of Care (Signed)

## 2023-07-09 NOTE — Progress Notes (Signed)
 Transport here patient taken off floor to get EEG done.
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
## 2023-07-09 NOTE — Progress Notes (Signed)
 Eeg done

## 2023-07-10 ENCOUNTER — Encounter (HOSPITAL_COMMUNITY): Payer: Self-pay

## 2023-07-10 ENCOUNTER — Inpatient Hospital Stay

## 2023-07-10 DIAGNOSIS — I480 Paroxysmal atrial fibrillation: Secondary | ICD-10-CM | POA: Diagnosis present

## 2023-07-10 DIAGNOSIS — R569 Unspecified convulsions: Secondary | ICD-10-CM | POA: Diagnosis not present

## 2023-07-10 DIAGNOSIS — C9000 Multiple myeloma not having achieved remission: Secondary | ICD-10-CM

## 2023-07-10 DIAGNOSIS — Z9484 Stem cells transplant status: Secondary | ICD-10-CM

## 2023-07-10 DIAGNOSIS — G4089 Other seizures: Secondary | ICD-10-CM | POA: Diagnosis not present

## 2023-07-10 DIAGNOSIS — Z95810 Presence of automatic (implantable) cardiac defibrillator: Secondary | ICD-10-CM

## 2023-07-10 DIAGNOSIS — C9001 Multiple myeloma in remission: Secondary | ICD-10-CM | POA: Diagnosis present

## 2023-07-10 DIAGNOSIS — I4892 Unspecified atrial flutter: Secondary | ICD-10-CM | POA: Insufficient documentation

## 2023-07-10 DIAGNOSIS — I5042 Chronic combined systolic (congestive) and diastolic (congestive) heart failure: Secondary | ICD-10-CM | POA: Diagnosis present

## 2023-07-10 DIAGNOSIS — R410 Disorientation, unspecified: Secondary | ICD-10-CM | POA: Diagnosis not present

## 2023-07-10 DIAGNOSIS — E669 Obesity, unspecified: Secondary | ICD-10-CM | POA: Diagnosis present

## 2023-07-10 LAB — VITAMIN B12: Vitamin B-12: 585 pg/mL (ref 180–914)

## 2023-07-10 LAB — FOLATE: Folate: 18.9 ng/mL (ref >5.9)

## 2023-07-10 MED ORDER — IOHEXOL 350 MG/ML SOLN
75.0000 mL | Freq: Once | INTRAVENOUS | Status: AC | PRN
  Administered 2023-07-10: 75 mL via INTRAVENOUS

## 2023-07-10 MED ORDER — IOHEXOL 350 MG/ML SOLN
75.0000 mL | Freq: Once | INTRAVENOUS | Status: DC | PRN
Start: 2023-07-10 — End: 2023-07-12

## 2023-07-10 MED ORDER — LEVETIRACETAM 500 MG PO TABS: 500.0000 mg | ORAL_TABLET | Freq: Two times a day (BID) | ORAL | 0 refills | Status: DC

## 2023-07-10 NOTE — Progress Notes (Signed)
 10:55-Patient ambulated to bathroom with walker, no complaint of headache or dizziness. Ambulating back to bathroom patient had became absent minded and starring not responding to verbal command.Assisted back to bed, V/S check and recorded. With patient siting out of bed. She had no lost of bowel or bladder, shaking or twitching seen. PRN med given for Seizure. MD aware of situation.

## 2023-07-10 NOTE — Discharge Summary (Addendum)
 Physician Discharge Summary   Patient: Carly Randall MRN: [id number] DOB: 05-12-1965  Admit date:     07/08/2023  Discharge date: 07/12/2023  Discharge Physician: Marcella Charlson   PCP: George, Sionne A, MD   Recommendations at discharge:    Transfer to [facility] for continuous EEG  Discharge Diagnoses: Principal Problem:   Seizure [facility]) Active Problems:   Chronic combined systolic and diastolic CHF (congestive heart failure) (HCC)   Paroxysmal atrial fibrillation (HCC)   Multiple myeloma in remission (HCC)   Obesity (BMI 30-39.9)  Resolved Problems:   * No resolved hospital problems. *  Hospital Course: Carly Randall is a 58 y.o. female with PMH significant for systolic CHF - recovered EF s/p AICD, paroxysmal A-fib status post ablation not on anticoagulation, multiple myeloma Patient was brought to the ED today after a witnessed seizure. Patient was at a salon getting her head done.  Suddenly without any premonition symptoms, she had a seizure-like episode witnessed by her friend.  She evidently went unresponsive with eyes rolled, foaming coming out of the mouth and started shaking bad.  She was laid down on the mat.  After few minutes, she opened her eyes, looked confused.  EMS noted her confusion postictal.  She was not able to verbalize anything to them. Patient does not remember any of the event. No prior history of seizure.   In the ED, patient was afebrile, heart rate 100, blood pressure 103/68, breathing on room air. Labs showed WBC 7.3, hemoglobin 10.9, BUN/creatinine 17/1.24, lactic acid 3.8 Urinalysis showed hazy yellow color urine with no bacteria CT head unremarkable   In the ED, her mental status gradually improved in next 1 to 2 hours.  She did not have any focal deficits. Pacemaker interrogation was requested. She was also given loading dose of IV Keppra  1500 mg. Hospitalist service was consulted for inpatient management.  07/08/23 -had a  witnessed tonic-clonic seizure while getting her head done and presented to the ER via EMS in a postictal state.  07/09/23 -had a witnessed sudden onset confusional event and received 2 g Keppra  x 1 dose, Ativan  2 mg x 1 and then started on Keppra  500 mg twice daily.  07/10/23 -patient remains confused and not back to her baseline.  Had another episode described as staring off into space and not responding to verbal stimuli.  Received a dose of Ativan  2 mg IV EEG shows sharp transients noted in the right frontal region.  Recommend to consider long-term EEG if concern for ictal-interictal activity presents.  Neurology recommends transfer to [facility] for continuous EEG.    Assessment and Plan:  Seizure-like activity Presented after a witnessed GTCS with eyes rolling, mouth foaming, lactic acidosis on lab No premonitory symptoms.   No prior history of seizure.   Per history, appears to be a new onset seizure In the ED, patient was given 1 dose of Keppra  1500 mg IV She had a witnessed event on 07/09/23 when she developed sudden acute confusional state and received Ativan , loading dose of Keppra  as well as maintenance dose of Keppra . EEG shows sharp transients were noted in right frontal region. Consider long term eeg if concern for ictal-interictal activity persists.  Patient's mental status is not back to baseline and neurology has recommended transfer to [facility] for continuous EEG.      H/o multiple myeloma Currently in remission.  Leflunomide is on hold CT had unremarkable for any intracranial metastasis. Follows up at [facility]  Chronic systolic CHF Per patient, it was secondary to chemotherapy, patient received for multiple myeloma- recovered EF s/p AICD Echo from March 2025 with EF 44%. Previously on Entresto.   Continue Coreg , Jardiance , Lasix      Paroxysmal A-fib s/p prior ablation Continue amiodarone  Not on chronic anticoagulation. Watchman in place    Neuropathy Gabapentin  3 times daily, Elavil , Norco as needed   Patient was seen and examined at bedside and will be transferred to [facility] for continuous EEG.        Consultants: Neurology Procedures performed: CT scan of the head, EEG Disposition: Transfer to [facility] Diet recommendation:  NPO   DISCHARGE MEDICATION: Allergies as of 07/10/2023       Reactions   Filgrastim Other (See Comments)   Other reaction(s): Other (See Comments) Other reaction(s): Other (See Comments) Severe pain Severe pain Severe pain   Pegfilgrastim    Other reaction(s): Other (See Comments) Severe bone pain Other reaction(s): Other (See Comments) Severe bone pain   Percocet [oxycodone-acetaminophen ] Itching   Codeine Itching   Naproxen Itching        Medication List     PAUSE taking these medications    lenalidomide  15 MG capsule Wait to take this until your doctor or other care provider tells you to start again. Commonly known as: REVLIMID  Take 15 mg by mouth daily.       STOP taking these medications    amoxicillin 875 MG tablet Commonly known as: AMOXIL   azithromycin 250 MG tablet Commonly known as: ZITHROMAX   carvedilol  6.25 MG tablet Commonly known as: COREG    clopidogrel  75 MG tablet Commonly known as: PLAVIX    HYDROcodone -acetaminophen  5-325 MG tablet Commonly known as: NORCO/VICODIN       TAKE these medications    allopurinol 100 MG tablet Commonly known as: ZYLOPRIM Take 100 mg by mouth 2 (two) times daily.   amiodarone  200 MG tablet Commonly known as: PACERONE  Take 200 mg by mouth 2 (two) times daily.   amitriptyline  25 MG tablet Commonly known as: ELAVIL  Take by mouth.   aspirin  EC 81 MG tablet Take by mouth.   empagliflozin  10 MG Tabs tablet Commonly known as: JARDIANCE  Take 1 tablet by mouth daily.   fluticasone 50 MCG/ACT nasal spray Commonly known as: FLONASE Place 2 sprays into both nostrils daily as needed.    furosemide  40 MG tablet Commonly known as: LASIX  Take 40 mg by mouth daily as needed (for swelling).   gabapentin  300 MG capsule Commonly known as: NEURONTIN  Take 300 mg by mouth 3 (three) times daily. What changed: Another medication with the same name was removed. Continue taking this medication, and follow the directions you see here.   levETIRAcetam  500 MG tablet Commonly known as: KEPPRA  Take 1 tablet (500 mg total) by mouth every 12 (twelve) hours.   losartan  25 MG tablet Commonly known as: COZAAR  Take 1 tablet by mouth daily.   magnesium 30 MG tablet Take 30 mg by mouth 2 (two) times daily.   metoprolol  succinate 50 MG 24 hr tablet Commonly known as: TOPROL -XL Take 1 tablet by mouth daily.   metoprolol  tartrate 25 MG tablet Commonly known as: LOPRESSOR  Take 25 mg by mouth every 6 (six) hours as needed.   mirtazapine 15 MG tablet Commonly known as: REMERON Take 15 mg by mouth at bedtime.   potassium chloride  20 MEQ packet Commonly known as: KLOR-CON  Take by mouth 2 (two) times daily.   spironolactone 25 MG tablet  Commonly known as: ALDACTONE Take 1 tablet by mouth daily.   Wegovy 2.4 MG/0.75ML Soaj Generic drug: Semaglutide-Weight Management Inject 2.4 mg into the skin once a week.        Follow-up Information     Zachary Idelia LABOR, MD Follow up.   Specialty: Family Medicine Why: Hospital follow up Contact information: [address] [place] [address] [phone]                Discharge Exam: Fredricka Weights   07/08/23 1752  Weight: 91.3 kg   General exam: Pleasant, middle-aged African-American female Skin: No rashes, lesions or ulcers. HEENT: Atraumatic, normocephalic, no obvious bleeding.  No evidence of tongue bite Lungs: Clear to auscultation bilaterally,  CVS: S1, S2, no murmur,   GI/Abd: Soft, nontender, nondistended, bowel sound present,   CNS: Alert, awake, oriented x 3 Psychiatry: Mood appropriate,  Extremities: No  pedal edema, no calf tenderness,     Condition at discharge: stable  The results of significant diagnostics from this hospitalization (including imaging, microbiology, ancillary and laboratory) are listed below for reference.   Imaging Studies: EEG adult Result Date: 07/09/2023 Shelton Arlin KIDD, MD     07/09/2023  6:31 PM Patient Name: Devri Kreher MRN: [id number] Epilepsy Attending: Arlin KIDD Shelton Referring Physician/Provider: Arlice Reichert, MD Date: 07/09/2023 Duration: 31.81mins Patient history: 58 y.o. female was brought to the ED today after a witnessed seizure. EEG to evaluate for seizure. Level of alertness: Awake, asleep AEDs during EEG study: LEV, Ativan ,GBP Technical aspects: This EEG study was done with scalp electrodes positioned according to the 10-20 International system of electrode placement. Electrical activity was reviewed with band pass filter of 1-70Hz , sensitivity of 7 uV/mm, display speed of 62mm/sec with a 60Hz  notched filter applied as appropriate. EEG data were recorded continuously and digitally stored.  Video monitoring was available and reviewed as appropriate. Description: The posterior dominant rhythm consists of 9 Hz activity of moderate voltage (25-35 uV) seen predominantly in posterior head regions, symmetric and reactive to eye opening and eye closing. Sleep was characterized by vertex waves, sleep spindles (12 to 14 Hz), maximal frontocentral region. Sharp transients were noted in right frontal region. Hyperventilation and photic stimulation were not performed.   IMPRESSION: This study is within normal limits. No seizures were seen throughout the recording. Sharp transients were noted in right frontal region. Consider long term eeg if concern for ictal-interictal activity persists. Priyanka O Yadav   CT Head Wo Contrast Result Date: 07/08/2023 CLINICAL DATA:  Altered mental status EXAM: CT HEAD WITHOUT CONTRAST TECHNIQUE: Contiguous axial images were obtained  from the base of the skull through the vertex without intravenous contrast. RADIATION DOSE REDUCTION: This exam was performed according to the departmental dose-optimization program which includes automated exposure control, adjustment of the mA and/or kV according to patient size and/or use of iterative reconstruction technique. COMPARISON:  06/08/2011 FINDINGS: Brain: No evidence of acute infarction, hemorrhage, hydrocephalus, extra-axial collection or mass lesion/mass effect. Vascular: No hyperdense vessel or unexpected calcification. Skull: Normal. Negative for fracture or focal lesion. Sinuses/Orbits: No acute finding. Other: None. IMPRESSION: No acute intracranial pathology. Electronically Signed   By: Marolyn JONETTA Jaksch M.D.   On: 07/08/2023 14:37    Microbiology: No results found for this or any previous visit.  Labs: CBC: Recent Labs  Lab 07/08/23 1208 07/09/23 0407  WBC 7.3 4.8  NEUTROABS 4.6  --   HGB 10.9* 8.6*  HCT 35.1* 27.6*  MCV 97.0 97.5  PLT  215 177   Basic Metabolic Panel: Recent Labs  Lab 07/08/23 1208 07/09/23 0407  NA 137 142  K 3.8 3.5  CL 105 112*  CO2 18* 25  GLUCOSE 143* 91  BUN 17 15  CREATININE 1.24* 1.17*  CALCIUM 8.9 8.3*   Liver Function Tests: Recent Labs  Lab 07/08/23 1208  AST 29  ALT 10  ALKPHOS 53  BILITOT 0.9  PROT 6.6  ALBUMIN 3.2*   CBG: No results for input(s): GLUCAP in the last 168 hours.  Discharge time spent: greater than 30 minutes.  Signed: Aimee Somerset, MD [facility] 07/10/2023

## 2023-07-10 NOTE — Plan of Care (Signed)

## 2023-07-10 NOTE — Progress Notes (Signed)
 Patient left floor with Tech via wheel chair. Husband took personal belongingDC instruction was done by Engineer, drilling.

## 2023-07-10 NOTE — Progress Notes (Signed)
 NEUROLOGY CONSULT FOLLOW UP NOTE   Date of service: July 10, 2023 Patient Name: Carly Randall MRN:  [id number] DOB:  [birth year]  Interval Hx/subjective  - Recurrent event this morning when ambulating with PT - Per mom and nursing didn't really return to baseline prior to this event  Additional oncological history reviewed 04/15/2023 oncology note:   "Clinically stable with no signs of disease progression. Doing well on treatment with no treatment related issues.   Results are pending today but I personally reviewed the last set of available restaging results including SPEP, IFE, serum free light chains, CBC, CMP and imaging. Based on these tests, interpreted as a whole, patient's MM remains in continued serological stringent complete response (sCR), i.e., very deep remission.  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   Anemia periodically exacerbated by hemorrhoidal bleeding, plus lenalidomide . Stable overall.  Given continued response will continue lenalidomide  at same dose and schedule. Have discussed the fact that we don't have good data to tell us  benefit at this stage (deep remission, many years out from ASCT) so we typically continue it but if she wanted to stop, it would be very fair. Have discussed long-term side effects of lenalidomide , which at this stage are really just the mild myelosuppression plus secondary malignancies. Despite the latter, lenalidomide  post-ASCT prolongs survival. She as continued to endorse staying on treatment and will let us  know if she would like to stop treatment in the future. "  Vitals   Vitals:   07/09/23 2016 07/10/23 0403 07/10/23 0809 07/10/23 1057  BP: (!) 94/55 103/60 (!) 91/54 110/79  Pulse: 67 65 67 83  Resp: 16  18 18   Temp: 97.8 F (36.6 C) 97.8 F (36.6 C) 97.7 F (36.5 C) 97.7 F (36.5 C)  TempSrc: Axillary     SpO2:  100% 97% 100% 99%  Weight:      Height:         Body mass index is 35.66 kg/m.  Physical Exam   Constitutional: Appears well-developed and well-nourished.  Psych: Affect flat Eyes: No scleral injection HENT: No oropharyngeal obstruction.  MSK: no joint deformities.  Cardiovascular: Normal rate and regular rhythm. Perfusing extremities well Respiratory: Effort normal, non-labored breathing GI: Soft.  No distension. There is no tenderness.  Skin: Warm dry and intact visible skin  Neurologic Examination   Mental Status: Patient is awake, alert, oriented to place ([facility]), month, but NOT self. Paucity of speech but follows simple commands Cranial Nerves: Tracks examiner bilaterally. Face symmetric, tongue midline, hearing intact to voice  Motor: Moves all four extremities freely antigravity without drift  Sensory: Equally reactive to touch in all four extremities   Medications  Current Facility-Administered Medications:    albuterol  (PROVENTIL ) (2.5 MG/3ML) 0.083% nebulizer solution 2.5 mg, 2.5 mg, Nebulization, Q6H PRN, Dahal, Binaya, MD   amitriptyline  (ELAVIL ) tablet 25 mg, 25 mg, Oral, QHS, Dahal, Binaya, MD, 25 mg at 07/09/23 2108   aspirin  EC tablet 81 mg, 81 mg, Oral, Daily, Dahal, Binaya, MD, 81 mg at 07/10/23 0951   bisacodyl  (DULCOLAX) EC tablet 5 mg, 5 mg, Oral, Daily PRN, Dahal, Binaya, MD   empagliflozin  (JARDIANCE ) tablet 10 mg, 10 mg, Oral, Daily, Dahal, Binaya, MD, 10 mg at 07/10/23 0951   enoxaparin  (LOVENOX ) injection 45 mg, 0.5 mg/kg, Subcutaneous, Q24H, Hallaji, Sheema M, RPH, 45 mg at 07/09/23 2122   furosemide  (LASIX ) tablet 20 mg, 20 mg, Oral, Daily, Dahal, Aminta Baldy, MD  gabapentin  (NEURONTIN ) capsule 100 mg, 100 mg, Oral, TID, Dahal, Binaya, MD, 100 mg at 07/10/23 0951   hydrALAZINE  (APRESOLINE ) injection 10 mg, 10 mg, Intravenous, Q6H PRN, Dahal, Aminta Baldy, MD   HYDROcodone -acetaminophen  (NORCO/VICODIN) 5-325 MG per tablet 1 tablet, 1 tablet, Oral, Q6H PRN,  Hoyt Macleod, MD, 1 tablet at 07/08/23 1752   levETIRAcetam  (KEPPRA ) tablet 500 mg, 500 mg, Oral, Q12H, 500 mg at 07/10/23 0521 **OR** levETIRAcetam  (KEPPRA ) undiluted injection 500 mg, 500 mg, Intravenous, Q12H, Nai Borromeo L, MD   LORazepam  (ATIVAN ) injection 2 mg, 2 mg, Intravenous, Q4H PRN, Agbata, Tochukwu, MD, 2 mg at 07/10/23 1058  Labs and Diagnostic Imaging     Basic Metabolic Panel: Recent Labs  Lab 07/08/23 1208 07/09/23 0407  NA 137 142  K 3.8 3.5  CL 105 112*  CO2 18* 25  GLUCOSE 143* 91  BUN 17 15  CREATININE 1.24* 1.17*  CALCIUM 8.9 8.3*    CBC: Recent Labs  Lab 07/08/23 1208 07/09/23 0407  WBC 7.3 4.8  NEUTROABS 4.6  --   HGB 10.9* 8.6*  HCT 35.1* 27.6*  MCV 97.0 97.5  PLT 215 177    Coagulation Studies: No results for input(s): "LABPROT", "INR" in the last 72 hours.   Urine Drug Screen:     Component Value Date/Time   LABOPIA NONE DETECTED 07/08/2023 1544   COCAINSCRNUR NONE DETECTED 07/08/2023 1544   LABBENZ NONE DETECTED 07/08/2023 1544   AMPHETMU NONE DETECTED 07/08/2023 1544   THCU NONE DETECTED 07/08/2023 1544   LABBARB NONE DETECTED 07/08/2023 1544    CT Head without contrast(Personally reviewed): no acute intracranial process    Neurodiagnostics rEEG:  This study is within normal limits. No seizures were seen throughout the recording. Sharp transients were noted in right frontal region. Consider long term EEG if concern for ictal-interictal activity persists.  Assessment   Carly Randall is a 58 y.o. female with hx of CHF, AICD (unclear if MRI-compatible), multiple myeloma s/p autologous stem cell transplant and continues on lenalidomide  (last home delivery 07/01/23), Afib s/p Watchman 04/2018 and ablation 08/2018 and 01/2023 off of Anticoagulation, multiple GI bleeds when on Xarelto, no prior history of seizures, anxiety  Presenting with witnessed seizure activity, now with recurrent unexplained confusion concerning for  recurrent focal seizure with altered awareness vs. unwitnessed generalized tonic clonic seizure, vs potential functional neurological disorder.   Given EEG findings and prior response to Keppra , resumed keppra  however she is not improving and had a recurrent event today again when ambulating to bathroom.   Exam looks highly functional (very atypical to be oriented to month and place but NOT self for example) rather than due to organic neurological dysfunction.   However given sudden change and EEG findings, needs LTM EEG for further diagnostic clarity.   On further review of the chart she does see a psychiatrist who notes some stressful living situations youngest brother moved in with her temporarily in November describes some insomnia trauma symptoms related to her autologous stem cell transplant. Insomnia was associated with initiation of amiodarone  February 2025  Overall low concern for meningitis / encephalitis based on vitals / exam. But given history of multiple myeloma and treatment with lenalidomide  may need to consider autoimmune encephalitis / PML depending on her clinical course. Given body habitus with BMI > 35 and significant central obesity will likely need LP under fluoro if pursued   Will start with CT chest abdomen pelvis for occult malignancy screening and also obtain CTA head and  neck to assess for any signs of CNS vasculitis, CT head w/ contrast as well as repeating plain head CT  Recommendations  - Have not been able to obtain MRI brain due to pacer, unclear if MRI conditional, will need family to provide pacer information when able - Will obtain CTA head and neck with 5 min delay head CT  - CT chest/abdomen/pelvis for occult malignancy screening - Transfer to [facility] for LTM EEG, consider weaning keppra  once there for diagnostic clarity - Continue keppra  500 mg BID for now - Ativan  only for clear seizure activity - Orthostatic vital signs  - Neurology will follow  along ______________________________________________________________________  Baldwin Levee MD-PhD [facility] [phone] Available 7 AM to 7 PM, outside these hours please contact Neurologist on call listed on AMION

## 2023-07-10 NOTE — Plan of Care (Signed)

## 2023-07-10 NOTE — TOC Transition Note (Signed)
 Transition of Care [facility]) - Discharge Note   Patient Details  Name: Carly Randall MRN: [id number] Date of Birth: 1965-09-13  [facility]) CM/SW Contact:  Alexandra Ice, RN Phone Number: 07/10/2023, 1:05 PM   Clinical Narrative:     Patient to discharge today, to home. No TOC needs identified at this time.   Final next level of care: Home/Self Care Barriers to Discharge: Barriers Resolved   Patient Goals and CMS Choice            Discharge Placement                  Name of family member notified: Devra Fontana Patient and family notified of of transfer: 07/10/23  Discharge Plan and Services Additional resources added to the After Visit Summary for                    DME Agency: NA       HH Arranged: NA          Social Drivers of Health (SDOH) Interventions SDOH Screenings   Food Insecurity: No Food Insecurity (07/08/2023)  Housing: Unknown (07/08/2023)  Transportation Needs: No Transportation Needs (07/08/2023)  Utilities: Not At Risk (07/08/2023)  Recent Concern: Utilities - High Risk (05/31/2023)   Received from [facility]  Financial Resource Strain: High Risk (05/31/2023)   Received from [facility]  Physical Activity: Sufficiently Active (07/17/2022)   Received from [facility]  Social Connections: Moderately Isolated (07/17/2022)   Received from [facility]  Stress: No Stress Concern Present (07/17/2022)   Received from [facility]  Tobacco Use: Low Risk  (07/08/2023)  Health Literacy: Low Risk  (10/17/2022)   Received from [facility]     Readmission Risk Interventions     No data to display

## 2023-07-11 DIAGNOSIS — K529 Noninfective gastroenteritis and colitis, unspecified: Secondary | ICD-10-CM

## 2023-07-11 DIAGNOSIS — G4089 Other seizures: Secondary | ICD-10-CM | POA: Diagnosis not present

## 2023-07-11 DIAGNOSIS — R569 Unspecified convulsions: Secondary | ICD-10-CM | POA: Diagnosis not present

## 2023-07-11 DIAGNOSIS — C9000 Multiple myeloma not having achieved remission: Secondary | ICD-10-CM | POA: Diagnosis not present

## 2023-07-11 DIAGNOSIS — R410 Disorientation, unspecified: Secondary | ICD-10-CM | POA: Diagnosis not present

## 2023-07-11 LAB — RPR: RPR Ser Ql: NONREACTIVE

## 2023-07-11 LAB — HIV ANTIBODY (ROUTINE TESTING W REFLEX): HIV Screen 4th Generation wRfx: NONREACTIVE

## 2023-07-11 MED ORDER — LEVETIRACETAM 750 MG PO TABS
750.0000 mg | ORAL_TABLET | Freq: Two times a day (BID) | ORAL | Status: DC
  Administered 2023-07-11: 750 mg via ORAL
  Filled 2023-07-11 (×2): qty 1

## 2023-07-11 MED ORDER — LEVETIRACETAM (KEPPRA) 500 MG/5 ML ADULT IV PUSH
500.0000 mg | Freq: Once | INTRAVENOUS | Status: AC
  Administered 2023-07-11: 500 mg via INTRAVENOUS
  Filled 2023-07-11: qty 5

## 2023-07-11 MED ORDER — LEVETIRACETAM (KEPPRA) 500 MG/5 ML ADULT IV PUSH
500.0000 mg | Freq: Two times a day (BID) | INTRAVENOUS | Status: DC
  Administered 2023-07-12: 500 mg via INTRAVENOUS
  Filled 2023-07-11 (×2): qty 5

## 2023-07-11 NOTE — Plan of Care (Signed)

## 2023-07-11 NOTE — Progress Notes (Addendum)
 https://www.bostonscientific.com/imageready/en-US /model-lookup.html  Per website this should be MRI conditional device on 1.5T or 3T scanner, but will need confirmation from technicians

## 2023-07-11 NOTE — Plan of Care (Addendum)
 Cardiac monitoring expiring 0017, Kevon Pellegrini MD notified, ok to let expire.  Problem: Education: Goal: Knowledge of General Education information will improve Description: Including pain rating scale, medication(s)/side effects and non-pharmacologic comfort measures Outcome: Progressing   Problem: Health Behavior/Discharge Planning: Goal: Ability to manage health-related needs will improve Outcome: Progressing   Problem: Clinical Measurements: Goal: Ability to maintain clinical measurements within normal limits will improve Outcome: Progressing Goal: Will remain free from infection Outcome: Progressing Goal: Diagnostic test results will improve Outcome: Progressing Goal: Respiratory complications will improve Outcome: Progressing Goal: Cardiovascular complication will be avoided Outcome: Progressing   Problem: Activity: Goal: Risk for activity intolerance will decrease Outcome: Progressing   Problem: Nutrition: Goal: Adequate nutrition will be maintained Outcome: Progressing   Problem: Coping: Goal: Level of anxiety will decrease Outcome: Progressing   Problem: Elimination: Goal: Will not experience complications related to bowel motility Outcome: Progressing Goal: Will not experience complications related to urinary retention Outcome: Progressing   Problem: Pain Managment: Goal: General experience of comfort will improve and/or be controlled Outcome: Progressing   Problem: Safety: Goal: Ability to remain free from injury will improve Outcome: Progressing   Problem: Skin Integrity: Goal: Risk for impaired skin integrity will decrease Outcome: Progressing

## 2023-07-11 NOTE — Progress Notes (Signed)
 Progress Note   Patient: Carly Randall [id number] DOB: May 10, 1965 DOA: 07/08/2023     2 DOS: the patient was seen and examined on 07/11/2023   Brief hospital course:  Carly Randall is a 58 y.o. female with PMH significant for systolic CHF - recovered EF s/p AICD, paroxysmal A-fib status post ablation not on anticoagulation, multiple myeloma Patient was brought to the ED today after a witnessed seizure. Patient was at a salon getting her head done.  Suddenly without any premonition symptoms, she had a seizure-like episode witnessed by her friend.  She evidently went unresponsive with eyes rolled, foaming coming out of the mouth and started shaking bad.  She was laid down on the mat.  After few minutes, she opened her eyes, looked confused.  EMS noted her confusion postictal.  She was not able to verbalize anything to them. Patient does not remember any of the event. No prior history of seizure.   In the ED, patient was afebrile, heart rate 100, blood pressure 103/68, breathing on room air. Labs showed WBC 7.3, hemoglobin 10.9, BUN/creatinine 17/1.24, lactic acid 3.8 Urinalysis showed hazy yellow color urine with no bacteria CT head unremarkable   In the ED, her mental status gradually improved in next 1 to 2 hours.  She did not have any focal deficits. Pacemaker interrogation was requested. She was also given loading dose of IV Keppra  1500 mg. Hospitalist service was consulted for inpatient management.   07/08/23 -had a witnessed tonic-clonic seizure while getting her head done and presented to the ER via EMS in a postictal state.   07/09/23 -had a witnessed sudden onset confusional event and received 2 g Keppra  x 1 dose, Ativan  2 mg x 1 and then started on Keppra  500 mg twice daily.   07/10/23 -patient remains confused and not back to her baseline.  Had another episode described as staring off into space and not responding to verbal stimuli.  Received a dose of Ativan  2  mg IV EEG shows sharp transients noted in the right frontal region.  Recommend to consider long-term EEG if concern for ictal-interictal activity presents.  Neurology recommends transfer to [facility] for continuous EEG.    07/11/23 -patient is more awake and alert this morning but slow to respond.  Mother is at the bedside.  Still awaiting transfer to [facility] for continuous EEG.  Dose of Keppra  increased to 750 mg twice daily.  Received an extra Keppra  500 mg IV x 1    Assessment and Plan:  Seizure-like activity Presented after a witnessed GTCS with eyes rolling, mouth foaming, lactic acidosis on lab No premonitory symptoms.   No prior history of seizure.   Per history, appears to be a new onset seizure In the ED, patient was given 1 dose of Keppra  1500 mg IV She had a witnessed event on 07/09/23 when she developed sudden acute confusional state and received Ativan , loading dose of Keppra  as well as maintenance dose of Keppra . EEG shows sharp transients were noted in right frontal region. Consider long term eeg if concern for ictal-interictal activity persists.  Patient's mental status is not back to baseline and neurology has recommended transfer to [facility] for continuous EEG. Patient had a CT angiogram which showed no large vessel occlusion. No high-grade stenosis, aneurysm, or dissection of the arteries in the head and neck. No CTA findings to suggest CNS vasculitis. Unable to obtain MRI due to AICD Appreciate neurology input.  Recommends increasing Keppra  dose  to 750 mg p.o. twice daily.  Patient received an extra Keppra  500 mg IV x 1     H/o multiple myeloma Currently in remission.  Leflunomide is on hold CT had unremarkable for any intracranial metastasis. Follows up at [facility]     Chronic systolic CHF Per patient, it was secondary to chemotherapy, patient received for multiple myeloma- recovered EF s/p AICD Echo from March 2025 with EF 44%. Previously on Entresto.    Continue Coreg , Jardiance , Lasix      Paroxysmal A-fib s/p prior ablation Continue amiodarone  Not on chronic anticoagulation. Watchman in place   Neuropathy Gabapentin  3 times daily, Elavil , Norco as needed       H/o multiple myeloma Currently in remission.  Leflunomide is on hold per oncology due to acute illness CT had unremarkable for any intracranial metastasis. Follows up at [facility]     Chronic systolic CHF Per patient, it was secondary to chemotherapy, patient received for multiple myeloma- recovered EF s/p AICD Echo from March 2025 with EF 44%. Previously on Entresto.   Continue Coreg , Jardiance , Lasix      Paroxysmal A-fib s/p prior ablation Continue amiodarone  Not on chronic anticoagulation. Watchman in place   Neuropathy Gabapentin  3 times daily, Elavil , Norco as needed         Subjective: Lying in bed.  Awake and alert but slow to respond  Physical Exam: Vitals:   07/10/23 1546 07/11/23 0054 07/11/23 0310 07/11/23 0845  BP: (!) 102/93 (!) 86/58 117/60 95/68  Pulse: (!) 105 79 81 73  Resp: 18 18  18   Temp: 97.8 F (36.6 C) 97.9 F (36.6 C) 97.8 F (36.6 C) 98 F (36.7 C)  TempSrc:      SpO2: 100% 100% 100% 100%  Weight:      Height:       General exam: Pleasant, middle-aged African-American female, lying in bed in no obvious distress Skin: No rashes, lesions or ulcers. HEENT: Atraumatic, normocephalic, no obvious bleeding.  No evidence of tongue bite Lungs: Clear to auscultation bilaterally,  CVS: S1, S2, no murmur,   GI/Abd: Soft, nontender, nondistended, bowel sound present,   CNS: Alert, awake, oriented x 3.  Slow to respond but appropriate Psychiatry: Mood appropriate,  Extremities: No pedal edema, no calf tenderness,       Data Reviewed:  There are no new results to review at this time.  Family Communication: Plan of care was discussed with patient and her mother at the bedside.  All questions and concerns have been addressed.   They verbalized understanding and agree with the plan.  Disposition: Status is: Inpatient Remains inpatient appropriate because: Awaiting transfer to [facility]  Planned Discharge Destination: TBD    Time spent: 40 minutes  Author: Read Camel, MD 07/11/2023 11:07 AM  For on call review [email or web address].
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
## 2023-07-11 NOTE — Plan of Care (Signed)

## 2023-07-12 ENCOUNTER — Observation Stay (HOSPITAL_COMMUNITY)

## 2023-07-12 ENCOUNTER — Inpatient Hospital Stay (HOSPITAL_COMMUNITY)
Admission: AD | Admit: 2023-07-12 | Discharge: 2023-07-15 | DRG: 101 | Disposition: A | Discharge status: Home / Self Care | Source: Other Acute Inpatient Hospital | Attending: Internal Medicine | Admitting: Internal Medicine

## 2023-07-12 DIAGNOSIS — R569 Unspecified convulsions: Principal | ICD-10-CM | POA: Diagnosis present

## 2023-07-12 DIAGNOSIS — E876 Hypokalemia: Secondary | ICD-10-CM | POA: Diagnosis not present

## 2023-07-12 DIAGNOSIS — Z7984 Long term (current) use of oral hypoglycemic drugs: Secondary | ICD-10-CM

## 2023-07-12 DIAGNOSIS — Z6835 Body mass index (BMI) 35.0-35.9, adult: Secondary | ICD-10-CM

## 2023-07-12 DIAGNOSIS — Z886 Allergy status to analgesic agent status: Secondary | ICD-10-CM

## 2023-07-12 DIAGNOSIS — Z7982 Long term (current) use of aspirin: Secondary | ICD-10-CM

## 2023-07-12 DIAGNOSIS — I480 Paroxysmal atrial fibrillation: Secondary | ICD-10-CM | POA: Diagnosis present

## 2023-07-12 DIAGNOSIS — Z9484 Stem cells transplant status: Secondary | ICD-10-CM

## 2023-07-12 DIAGNOSIS — Z888 Allergy status to other drugs, medicaments and biological substances status: Secondary | ICD-10-CM

## 2023-07-12 DIAGNOSIS — G629 Polyneuropathy, unspecified: Secondary | ICD-10-CM | POA: Diagnosis present

## 2023-07-12 DIAGNOSIS — I110 Hypertensive heart disease with heart failure: Secondary | ICD-10-CM | POA: Diagnosis present

## 2023-07-12 DIAGNOSIS — Z820 Family history of epilepsy and other diseases of the nervous system: Secondary | ICD-10-CM

## 2023-07-12 DIAGNOSIS — Z95810 Presence of automatic (implantable) cardiac defibrillator: Secondary | ICD-10-CM

## 2023-07-12 DIAGNOSIS — Z79899 Other long term (current) drug therapy: Secondary | ICD-10-CM

## 2023-07-12 DIAGNOSIS — G4700 Insomnia, unspecified: Secondary | ICD-10-CM | POA: Diagnosis present

## 2023-07-12 DIAGNOSIS — I5022 Chronic systolic (congestive) heart failure: Secondary | ICD-10-CM | POA: Diagnosis present

## 2023-07-12 DIAGNOSIS — E66812 Obesity, class 2: Secondary | ICD-10-CM | POA: Diagnosis present

## 2023-07-12 DIAGNOSIS — Z885 Allergy status to narcotic agent status: Secondary | ICD-10-CM

## 2023-07-12 DIAGNOSIS — R5381 Other malaise: Secondary | ICD-10-CM | POA: Diagnosis present

## 2023-07-12 DIAGNOSIS — C9001 Multiple myeloma in remission: Secondary | ICD-10-CM | POA: Diagnosis present

## 2023-07-12 DIAGNOSIS — Z8249 Family history of ischemic heart disease and other diseases of the circulatory system: Secondary | ICD-10-CM

## 2023-07-12 LAB — CBC
HCT: 33.3 % — ABNORMAL LOW (ref 36.0–46.0)
Hemoglobin: 10.4 g/dL — ABNORMAL LOW (ref 12.0–15.0)
MCH: 29.7 pg (ref 26.0–34.0)
MCHC: 31.2 g/dL (ref 30.0–36.0)
MCV: 95.1 fL (ref 80.0–100.0)
Platelets: 185 10*3/uL (ref 150–400)
RBC: 3.5 MIL/uL — ABNORMAL LOW (ref 3.87–5.11)
RDW: 15.4 % (ref 11.5–15.5)
WBC: 6.2 10*3/uL (ref 4.0–10.5)
nRBC: 0 % (ref 0.0–0.2)

## 2023-07-12 LAB — CREATININE, SERUM
Creatinine, Ser: 1.45 mg/dL — ABNORMAL HIGH (ref 0.44–1.00)
GFR, Estimated: 42 mL/min — ABNORMAL LOW (ref >60)

## 2023-07-12 MED ORDER — LEVETIRACETAM 250 MG PO TABS
500.0000 mg | ORAL_TABLET | Freq: Two times a day (BID) | ORAL | Status: DC
  Administered 2023-07-12 – 2023-07-15 (×6): 500 mg via ORAL
  Filled 2023-07-12 (×7): qty 2

## 2023-07-12 MED ORDER — LOSARTAN POTASSIUM 50 MG PO TABS
25.0000 mg | ORAL_TABLET | Freq: Every day | ORAL | Status: DC
  Administered 2023-07-12: 25 mg via ORAL
  Filled 2023-07-12: qty 1

## 2023-07-12 MED ORDER — ASPIRIN 81 MG PO TBEC
81.0000 mg | DELAYED_RELEASE_TABLET | Freq: Every day | ORAL | Status: DC
  Administered 2023-07-13 – 2023-07-15 (×3): 81 mg via ORAL
  Filled 2023-07-12 (×4): qty 1

## 2023-07-12 MED ORDER — ENOXAPARIN SODIUM 40 MG/0.4ML IJ SOSY
40.0000 mg | PREFILLED_SYRINGE | INTRAMUSCULAR | Status: DC
  Administered 2023-07-13 – 2023-07-14 (×2): 40 mg via SUBCUTANEOUS
  Filled 2023-07-12 (×3): qty 0.4

## 2023-07-12 MED ORDER — LEVETIRACETAM 750 MG PO TABS: 750.0000 mg | ORAL_TABLET | Freq: Two times a day (BID) | ORAL | Status: DC

## 2023-07-12 MED ORDER — METOPROLOL SUCCINATE ER 50 MG PO TB24
50.0000 mg | ORAL_TABLET | Freq: Every day | ORAL | Status: DC
  Administered 2023-07-12: 50 mg via ORAL
  Filled 2023-07-12: qty 1

## 2023-07-12 NOTE — Progress Notes (Signed)
 Pt arrived to rm 5w10 from [facility] ED. Obtained VS. Oriented pt to the unit. Paged triad admission. Waiting for orders.   Oral Billings, RN

## 2023-07-12 NOTE — Progress Notes (Signed)
 LTM EEG hooked up and running - no initial skin breakdown - push button tested - [facility] monitoring.

## 2023-07-12 NOTE — H&P (Addendum)
 History and Physical    Patient: Carly Randall ZOX:[id number] DOB: 1965/03/03 DOA: 07/12/2023 DOS: the patient was seen and examined on 07/12/2023 PCP: Alexander Anes, MD  Patient coming from: Outside Hospital  Chief Complaint: No chief complaint on file.  HPI: Carly Randall is a 58 y.o. female with medical history significant of HFpEF, s/p placement of a Environmental manager Dual ICD, multiple myeloma, Afib s/p abalation off of Anticoagulation, bariatric weight loss surgery, multiple GI bleeds in the past when on Xarelto /w acute encephalopathy 2/2 seizure.  Pt is unable to provide a medical history as she does note remember the event.  Per EDP, "Patient was brought to the ED today after a witnessed seizure. Patient was at a salon getting her head done.  Suddenly without any premonition symptoms, she had a seizure-like episode witnessed by her friend.  She evidently went unresponsive with eyes rolled, foaming coming out of the mouth and started shaking bad.  She was laid down on the mat.  After few minutes, she opened her eyes, looked confused.  EMS noted her confusion postictal.  She was not able to verbalize anything to them. Patient does not remember any of the event. No prior history of seizure.   In the ED, patient was afebrile, heart rate 100, blood pressure 103/68, breathing on room air. Labs showed WBC 7.3, hemoglobin 10.9, BUN/creatinine 17/1.24, lactic acid 3.8 Urinalysis showed hazy yellow color urine with no bacteria CT head unremarkable   In the ED, her mental status gradually improved in next 1 to 2 hours.  She did not have any focal deficits. Pacemaker interrogation was requested. She was also given loading dose of IV Keppra  1500 mg. Hospitalist service was consulted for inpatient management.   07/08/23 -had a witnessed tonic-clonic seizure while getting her head done and presented to the ER via EMS in a postictal state.   07/09/23 -had a witnessed sudden  onset confusional event and received 2 g Keppra  x 1 dose, Ativan  2 mg x 1 and then started on Keppra  500 mg twice daily.   07/10/23 -patient remains confused and not back to her baseline.  Had another episode described as staring off into space and not responding to verbal stimuli.  Received a dose of Ativan  2 mg IV EEG shows sharp transients noted in the right frontal region.  Recommend to consider long-term EEG if concern for ictal-interictal activity presents.  Neurology recommends transfer to [facility] for continuous EEG." I Review of Systems: As mentioned in the history of present illness. All other systems reviewed and are negative. Past Medical History:  Diagnosis Date   Cancer (HCC) 11/2011   multiple myeloma   Congestive heart failure (CHF) (HCC)    Past Surgical History:  Procedure Laterality Date   ABDOMINAL HYSTERECTOMY     BREAST BIOPSY Right 2011   neg   HAND SURGERY     Social History:  reports that she has never smoked. She has never used smokeless tobacco. She reports that she does not drink alcohol and does not use drugs.  Allergies  Allergen Reactions   Filgrastim Other (See Comments)    Other reaction(s): Other (See Comments) Other reaction(s): Other (See Comments) Severe pain Severe pain Severe pain    Pegfilgrastim     Other reaction(s): Other (See Comments) Severe bone pain Other reaction(s): Other (See Comments) Severe bone pain    Percocet [Oxycodone-Acetaminophen ] Itching   Codeine Itching   Naproxen Itching    Family History  Problem Relation Age of Onset   Hyperthyroidism Mother    Heart disease Father     Prior to Admission medications   Medication Sig Start Date End Date Taking? Authorizing Provider  allopurinol (ZYLOPRIM) 100 MG tablet Take 100 mg by mouth 2 (two) times daily. 12/17/22 12/17/23  [provider]  amiodarone  (PACERONE ) 200 MG tablet Take 200 mg by mouth 2 (two) times daily. Patient not taking: Reported on  07/08/2023 03/09/23   [provider]  amitriptyline  (ELAVIL ) 25 MG tablet Take by mouth. 07/19/17   [provider]  aspirin  EC 81 MG tablet Take by mouth.    [provider]  empagliflozin  (JARDIANCE ) 10 MG TABS tablet Take 1 tablet by mouth daily. 06/24/23   [provider]  fluticasone (FLONASE) 50 MCG/ACT nasal spray Place 2 sprays into both nostrils daily as needed. 02/26/23   [provider]  furosemide  (LASIX ) 40 MG tablet Take 40 mg by mouth daily as needed (for swelling). 12/10/22 12/10/23  [provider]  gabapentin  (NEURONTIN ) 300 MG capsule Take 300 mg by mouth 3 (three) times daily.    [provider]  lenalidomide  (REVLIMID ) 15 MG capsule Take 15 mg by mouth daily.    [provider]  levETIRAcetam  (KEPPRA ) 500 MG tablet Take 1 tablet (500 mg total) by mouth every 12 (twelve) hours. 07/10/23 08/09/23  Read Camel, MD  losartan (COZAAR) 25 MG tablet Take 1 tablet by mouth daily. 12/17/22 12/17/23  [provider]  magnesium 30 MG tablet Take 30 mg by mouth 2 (two) times daily.    [provider]  metoprolol succinate (TOPROL-XL) 50 MG 24 hr tablet Take 1 tablet by mouth daily. 09/16/22 09/16/23  [provider]  metoprolol tartrate (LOPRESSOR) 25 MG tablet Take 25 mg by mouth every 6 (six) hours as needed. 09/03/17 11/30/23  [provider]  mirtazapine (REMERON) 15 MG tablet Take 15 mg by mouth at bedtime. 10/26/22 10/26/23  [provider]  potassium chloride  (KLOR-CON ) 20 MEQ packet Take by mouth 2 (two) times daily.    [provider]  spironolactone (ALDACTONE) 25 MG tablet Take 1 tablet by mouth daily. 12/10/22 12/10/23  [provider]  WEGOVY 2.4 MG/0.75ML SOAJ Inject 2.4 mg into the skin once a week. 05/22/23   [provider]    Physical Exam: Vitals:   07/12/23 1054 07/12/23 1233 07/12/23 1331  BP: 125/72 100/69 108/68  Pulse: 85 71 71  Resp:  19 18 17   Temp: 98.1 F (36.7 C) 98 F (36.7 C)   TempSrc: Oral Oral   SpO2: 99%  99%   General: Alert, oriented x1 (person only), resting comfortably in no acute distress Respiratory: Lungs clear to auscultation bilaterally with normal respiratory effort; no w/r/r Cardiovascular: Regular rate and rhythm w/o m/r/g  Data Reviewed:  There are no new results to review at this time.  Assessment and Plan: 9F h/o HFpEF, s/p placement of a Environmental manager Dual ICD, multiple myeloma, Afib s/p abalation off of Anticoagulation, bariatric weight loss surgery, multiple GI bleeds in the past when on Xarelto /w acute encephalopathy 2/2 seizure.  Acute encephalopathy Seizure -Neurology following; apprec eval/recs --LTM EEG has been ordered.  --Will need MRI brain after EEG. Per website this should be MRI conditional device on 1.5T or 3T scanner, but will need confirmation from technicians  --Inpatient seizure precautions.  --Outpatient seizure precautions: Per [place]  DMV statutes, patients with seizures are not allowed to drive until  they have been seizure-free for six months. Use caution when using heavy equipment or power tools. Avoid working on ladders or at heights. Take showers instead of baths. Ensure the water temperature is not too high on the home water heater. Do not go swimming alone. When caring for infants or small children, sit down when holding, feeding, or changing them to minimize risk of injury to the child in the event you have a seizure. Also, Maintain good sleep hygiene. Avoid alcohol. --Continue Keppra  500 mg BID.   Afib HFpEF (EF 40-45% in 10/2021) -PTA metoprolol XL 50mg  daily and ASA 81mg  daily  HTN -PTA losartan   Advance Care Planning:   Code Status: Full Code   Consults: Neurology  Family Communication: Mother, Devra Fontana.  Severity of Illness: The appropriate patient status for this patient is INPATIENT. Inpatient status is judged to be reasonable and  necessary in order to provide the required intensity of service to ensure the patient's safety. The patient's presenting symptoms, physical exam findings, and initial radiographic and laboratory data in the context of their chronic comorbidities is felt to place them at high risk for further clinical deterioration. Furthermore, it is not anticipated that the patient will be medically stable for discharge from the hospital within 2 midnights of admission.   * I certify that at the point of admission it is my clinical judgment that the patient will require inpatient hospital care spanning beyond 2 midnights from the point of admission due to high intensity of service, high risk for further deterioration and high frequency of surveillance required.*   ------- I spent 55 minutes reviewing previous labs/notes, obtaining separate history at the bedside, counseling/discussing the treatment plan outlined above, ordering medications/tests, and performing clinical documentation.  Author: Arne Langdon, MD 07/12/2023 3:37 PM  For on call review [email or web address].

## 2023-07-12 NOTE — Progress Notes (Signed)
 Progress Note   Patient: Carly Randall VWU:[id number] DOB: Oct 31, 1965 DOA: 07/08/2023     3 DOS: the patient was seen and examined on 07/12/2023   Brief hospital course:  Carly Randall is a 58 y.o. female with PMH significant for systolic CHF - recovered EF s/p AICD, paroxysmal A-fib status post ablation not on anticoagulation, multiple myeloma Patient was brought to the ED today after a witnessed seizure. Patient was at a salon getting her head done.  Suddenly without any premonition symptoms, she had a seizure-like episode witnessed by her friend.  She evidently went unresponsive with eyes rolled, foaming coming out of the mouth and started shaking bad.  She was laid down on the mat.  After few minutes, she opened her eyes, looked confused.  EMS noted her confusion postictal.  She was not able to verbalize anything to them. Patient does not remember any of the event. No prior history of seizure.   In the ED, patient was afebrile, heart rate 100, blood pressure 103/68, breathing on room air. Labs showed WBC 7.3, hemoglobin 10.9, BUN/creatinine 17/1.24, lactic acid 3.8 Urinalysis showed hazy yellow color urine with no bacteria CT head unremarkable   In the ED, her mental status gradually improved in next 1 to 2 hours.  She did not have any focal deficits. Pacemaker interrogation was requested. She was also given loading dose of IV Keppra  1500 mg. Hospitalist service was consulted for inpatient management.   07/08/23 -had a witnessed tonic-clonic seizure while getting her head done and presented to the ER via EMS in a postictal state.   07/09/23 -had a witnessed sudden onset confusional event and received 2 g Keppra  x 1 dose, Ativan  2 mg x 1 and then started on Keppra  500 mg twice daily.   07/10/23 -patient remains confused and not back to her baseline.  Had another episode described as staring off into space and not responding to verbal stimuli.  Received a dose of Ativan  2  mg IV EEG shows sharp transients noted in the right frontal region.  Recommend to consider long-term EEG if concern for ictal-interictal activity presents.  Neurology recommends transfer to [facility] for continuous EEG.     07/11/23 -patient is more awake and alert this morning but slow to respond.  Mother is at the bedside.  Still awaiting transfer to [facility] for continuous EEG.  Dose of Keppra  increased to 750 mg twice daily.  Received an extra Keppra  500 mg IV x 1   07/12/23 - Patient is more awake and alert and back to her baseline. For transfer to [facility] today for continuous EEG     Assessment and Plan:   Seizure-like activity Presented after a witnessed GTCS with eyes rolling, mouth foaming, lactic acidosis on lab No premonitory symptoms.   No prior history of seizure.   Per history, appears to be a new onset seizure In the ED, patient was given 1 dose of Keppra  1500 mg IV She had a witnessed event on 07/09/23 when she developed sudden acute confusional state and received Ativan , loading dose of Keppra  as well as maintenance dose of Keppra . EEG shows sharp transients were noted in right frontal region. Consider long term eeg if concern for ictal-interictal activity persists.  Patient's mental status is not back to baseline and neurology has recommended transfer to [facility] for continuous EEG. Patient had a CT angiogram which showed no large vessel occlusion. No high-grade stenosis, aneurysm, or dissection of the arteries  in the head and neck. No CTA findings to suggest CNS vasculitis. Unable to obtain MRI due to AICD Appreciate neurology input.  Continue Keppra  750 mg twice daily Transfer to [facility] today for continuous EEG     H/o multiple myeloma Currently in remission.  Leflunomide is on hold CT had unremarkable for any intracranial metastasis. Follow up at [facility]     Chronic systolic CHF Per patient, it was secondary to chemotherapy, patient received  for multiple myeloma- recovered EF s/p AICD Echo from March 2025 with EF 44%. Previously on Entresto.   Continue Coreg , Jardiance , Lasix      Paroxysmal A-fib s/p prior ablation Continue amiodarone  Not on chronic anticoagulation. Watchman in place    Neuropathy Gabapentin  3 times daily, Elavil , Norco as needed             Subjective: Patient is seen and examined at the bedside. More awake and alert.  Physical Exam: Vitals:   07/11/23 2322 07/12/23 0408 07/12/23 0930 07/12/23 0936  BP: (!) 98/58 112/65 101/74 112/65  Pulse: 86 (!) 108 88 (!) 108  Resp: 18 16 16 16   Temp: 98 F (36.7 C) 98 F (36.7 C) 97.7 F (36.5 C) 98 F (36.7 C)  TempSrc:      SpO2: 100% 100% 100% 95%  Weight:      Height:       General exam: Pleasant, middle-aged African-American female, lying in bed in no obvious distress Skin: No rashes, lesions or ulcers. HEENT: Atraumatic, normocephalic, no obvious bleeding.  No evidence of tongue bite Lungs: Clear to auscultation bilaterally,  CVS: S1, S2, no murmur,   GI/Abd: Soft, nontender, nondistended, bowel sound present,   CNS: Alert, awake, oriented x 3.  Slow to respond but appropriate Psychiatry: Mood appropriate,  Extremities: No pedal edema, no calf tenderness,      Data Reviewed:  There are no new results to review at this time.  Family Communication: Plan of care was discussed with patient in detail. She verbalizes understanding and agrees with the plan.  Disposition:  Inpatient  Planned Discharge Destination: Transfer to [facility]    Time spent: 34 minutes  Author: Read Camel, MD 07/12/2023 10:24 AM  For on call review [email or web address].

## 2023-07-12 NOTE — Plan of Care (Signed)
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
## 2023-07-12 NOTE — Care Management Obs Status (Signed)
 MEDICARE OBSERVATION STATUS NOTIFICATION   Patient Details  Name: Carly Randall MRN: [id number] Date of Birth: July 07, 1965   Medicare Observation Status Notification Given:     Patient declined a copy but did sign  Che Below 07/12/2023, 3:27 PM

## 2023-07-12 NOTE — TOC Transition Note (Signed)
 Transition of Care [facility]) - Discharge Note   Patient Details  Name: Carly Randall MRN: [id number] Date of Birth: 1965-05-07  Transition of Care [facility]) CM/SW Contact:  Crayton Docker, RN 07/12/2023, 3:49 PM   Clinical Narrative:     Discharge orders noted for hospital to hospital transfer to [facility].   Final next level of care: Acute to Acute Transfer Barriers to Discharge: No Barriers Identified   Patient Goals and CMS Choice    Hospital to Hospital transfer   Discharge Placement   Hospital to Hospital transfer     Name of family member notified: Devra Fontana Patient and family notified of of transfer: 07/10/23  Discharge Plan and Services Additional resources added to the After Visit Summary for            DME Agency: NA    HH Arranged: NA  Social Drivers of Health (SDOH) Interventions SDOH Screenings   Food Insecurity: No Food Insecurity (07/12/2023)  Housing: Unknown (07/12/2023)  Transportation Needs: No Transportation Needs (07/12/2023)  Utilities: Not At Risk (07/12/2023)  Recent Concern: Utilities - High Risk (05/31/2023)   Received from [facility]  Financial Resource Strain: High Risk (05/31/2023)   Received from [facility]  Physical Activity: Sufficiently Active (07/17/2022)   Received from [facility]  Social Connections: Moderately Isolated (07/17/2022)   Received from [facility]  Stress: No Stress Concern Present (07/17/2022)   Received from [facility]  Tobacco Use: Low Risk  (07/08/2023)  Health Literacy: Low Risk  (10/17/2022)   Received from [facility]     Readmission Risk Interventions     No data to display

## 2023-07-13 ENCOUNTER — Observation Stay (HOSPITAL_COMMUNITY)

## 2023-07-13 DIAGNOSIS — G4700 Insomnia, unspecified: Secondary | ICD-10-CM | POA: Diagnosis present

## 2023-07-13 DIAGNOSIS — I4891 Unspecified atrial fibrillation: Secondary | ICD-10-CM | POA: Diagnosis not present

## 2023-07-13 DIAGNOSIS — I5022 Chronic systolic (congestive) heart failure: Secondary | ICD-10-CM | POA: Diagnosis present

## 2023-07-13 DIAGNOSIS — Z9484 Stem cells transplant status: Secondary | ICD-10-CM | POA: Diagnosis not present

## 2023-07-13 DIAGNOSIS — Z7982 Long term (current) use of aspirin: Secondary | ICD-10-CM | POA: Diagnosis not present

## 2023-07-13 DIAGNOSIS — R5381 Other malaise: Secondary | ICD-10-CM | POA: Diagnosis present

## 2023-07-13 DIAGNOSIS — Z886 Allergy status to analgesic agent status: Secondary | ICD-10-CM | POA: Diagnosis not present

## 2023-07-13 DIAGNOSIS — Z95810 Presence of automatic (implantable) cardiac defibrillator: Secondary | ICD-10-CM | POA: Diagnosis not present

## 2023-07-13 DIAGNOSIS — G6289 Other specified polyneuropathies: Secondary | ICD-10-CM | POA: Diagnosis not present

## 2023-07-13 DIAGNOSIS — G4089 Other seizures: Secondary | ICD-10-CM

## 2023-07-13 DIAGNOSIS — I480 Paroxysmal atrial fibrillation: Secondary | ICD-10-CM | POA: Diagnosis present

## 2023-07-13 DIAGNOSIS — Z888 Allergy status to other drugs, medicaments and biological substances status: Secondary | ICD-10-CM | POA: Diagnosis not present

## 2023-07-13 DIAGNOSIS — I5020 Unspecified systolic (congestive) heart failure: Secondary | ICD-10-CM

## 2023-07-13 DIAGNOSIS — R569 Unspecified convulsions: Secondary | ICD-10-CM | POA: Diagnosis present

## 2023-07-13 DIAGNOSIS — Z885 Allergy status to narcotic agent status: Secondary | ICD-10-CM | POA: Diagnosis not present

## 2023-07-13 DIAGNOSIS — G629 Polyneuropathy, unspecified: Secondary | ICD-10-CM | POA: Diagnosis present

## 2023-07-13 DIAGNOSIS — C9001 Multiple myeloma in remission: Secondary | ICD-10-CM | POA: Diagnosis present

## 2023-07-13 DIAGNOSIS — Z8249 Family history of ischemic heart disease and other diseases of the circulatory system: Secondary | ICD-10-CM | POA: Diagnosis not present

## 2023-07-13 DIAGNOSIS — Z7984 Long term (current) use of oral hypoglycemic drugs: Secondary | ICD-10-CM | POA: Diagnosis not present

## 2023-07-13 DIAGNOSIS — I110 Hypertensive heart disease with heart failure: Secondary | ICD-10-CM | POA: Diagnosis present

## 2023-07-13 DIAGNOSIS — Z79899 Other long term (current) drug therapy: Secondary | ICD-10-CM | POA: Diagnosis not present

## 2023-07-13 DIAGNOSIS — Z820 Family history of epilepsy and other diseases of the nervous system: Secondary | ICD-10-CM | POA: Diagnosis not present

## 2023-07-13 DIAGNOSIS — E876 Hypokalemia: Secondary | ICD-10-CM | POA: Diagnosis not present

## 2023-07-13 DIAGNOSIS — E66812 Obesity, class 2: Secondary | ICD-10-CM | POA: Diagnosis present

## 2023-07-13 DIAGNOSIS — Z6835 Body mass index (BMI) 35.0-35.9, adult: Secondary | ICD-10-CM | POA: Diagnosis not present

## 2023-07-13 LAB — CBC
HCT: 29.4 % — ABNORMAL LOW (ref 36.0–46.0)
Hemoglobin: 9.4 g/dL — ABNORMAL LOW (ref 12.0–15.0)
MCH: 30 pg (ref 26.0–34.0)
MCHC: 32 g/dL (ref 30.0–36.0)
MCV: 93.9 fL (ref 80.0–100.0)
Platelets: 163 10*3/uL (ref 150–400)
RBC: 3.13 MIL/uL — ABNORMAL LOW (ref 3.87–5.11)
RDW: 15.5 % (ref 11.5–15.5)
WBC: 5.5 10*3/uL (ref 4.0–10.5)
nRBC: 0 % (ref 0.0–0.2)

## 2023-07-13 LAB — BASIC METABOLIC PANEL WITH GFR
Anion gap: 6 (ref 5–15)
BUN: 16 mg/dL (ref 6–20)
CO2: 26 mmol/L (ref 22–32)
Calcium: 7.8 mg/dL — ABNORMAL LOW (ref 8.9–10.3)
Chloride: 105 mmol/L (ref 98–111)
Creatinine, Ser: 1.12 mg/dL — ABNORMAL HIGH (ref 0.44–1.00)
GFR, Estimated: 57 mL/min — ABNORMAL LOW (ref >60)
Glucose, Bld: 83 mg/dL (ref 70–99)
Potassium: 3.2 mmol/L — ABNORMAL LOW (ref 3.5–5.1)
Sodium: 137 mmol/L (ref 135–145)

## 2023-07-13 LAB — LACTIC ACID, PLASMA: Lactic Acid, Venous: 0.9 mmol/L (ref 0.5–1.9)

## 2023-07-13 MED ORDER — GABAPENTIN 100 MG PO CAPS
100.0000 mg | ORAL_CAPSULE | Freq: Three times a day (TID) | ORAL | Status: DC
  Administered 2023-07-13 – 2023-07-15 (×6): 100 mg via ORAL
  Filled 2023-07-13 (×7): qty 1

## 2023-07-13 MED ORDER — EMPAGLIFLOZIN 10 MG PO TABS
10.0000 mg | ORAL_TABLET | Freq: Every day | ORAL | Status: DC
  Administered 2023-07-13 – 2023-07-15 (×3): 10 mg via ORAL
  Filled 2023-07-13 (×3): qty 1

## 2023-07-13 MED ORDER — SODIUM CHLORIDE 0.9 % IV BOLUS
250.0000 mL | Freq: Once | INTRAVENOUS | Status: AC
  Administered 2023-07-13: 250 mL via INTRAVENOUS

## 2023-07-13 MED ORDER — POTASSIUM CHLORIDE CRYS ER 20 MEQ PO TBCR
40.0000 meq | EXTENDED_RELEASE_TABLET | Freq: Once | ORAL | Status: AC
  Administered 2023-07-13: 40 meq via ORAL
  Filled 2023-07-13: qty 2

## 2023-07-13 MED ORDER — ACETAMINOPHEN 325 MG PO TABS
650.0000 mg | ORAL_TABLET | Freq: Four times a day (QID) | ORAL | Status: DC | PRN
  Administered 2023-07-13: 650 mg via ORAL
  Filled 2023-07-13: qty 2

## 2023-07-13 NOTE — Progress Notes (Signed)
 EEG maint complete. No SBD at fp1 fp2 f7

## 2023-07-13 NOTE — Progress Notes (Signed)
 Pt's BP running low, 80s. All other vitals are normal. Pt asymptomatic. MD aware. Will continue to monitor.

## 2023-07-13 NOTE — Procedures (Signed)
 Patient Name: Carly Randall  MRN: [id number]  Epilepsy Attending: Arleene Lack  Referring Physician/Provider: Kimberley Penman, MD  Duration: 07/12/2023 1231 to 07/13/2023 1231   Patient history: 58 y.o. female was brought to the ED today after a witnessed seizure. EEG to evaluate for seizure.   Level of alertness: Awake, asleep   AEDs during EEG study: LEV, GBP   Technical aspects: This EEG study was done with scalp electrodes positioned according to the 10-20 International system of electrode placement. Electrical activity was reviewed with band pass filter of 1-70Hz , sensitivity of 7 uV/mm, display speed of 64mm/sec with a 60Hz  notched filter applied as appropriate. EEG data were recorded continuously and digitally stored.  Video monitoring was available and reviewed as appropriate.   Description: The posterior dominant rhythm consists of 9 Hz activity of moderate voltage (25-35 uV) seen predominantly in posterior head regions, symmetric and reactive to eye opening and eye closing. Sleep was characterized by vertex waves, sleep spindles (12 to 14 Hz), maximal frontocentral region. Hyperventilation and photic stimulation were not performed.      IMPRESSION: This study is within normal limits. No seizures were seen throughout the recording.  Carly Randall

## 2023-07-13 NOTE — Progress Notes (Addendum)
 PROGRESS NOTE        PATIENT DETAILS Name: Carly Randall Age: 58 y.o. Sex: female Date of Birth: December 03, 1965 Admit Date: 07/12/2023 Admitting Physician Arne Langdon, MD [id number], Brien Can, MD  Brief Summary: Patient is a 58 y.o.  female with history of HFrEF-s/p ICD implantation, PAF-s/p Watchman device, multiple myeloma (in remission)-presented as a transfer from [facility] for LTM EEG/MRI-after she was found to have new onset seizures.  Significant events: 6/9>> admit to [facility]-transfer from [facility] for LTM EEG/MRI brain.  Significant studies: 6/5>> CT head: No acute intracranial abnormality 6 6>> EEG: No seizures. 6/7>> CTA head/neck: No LVO-no high-grade stenosis in head/neck. 6/7>> CT chest/abdomen/pelvis: No evidence of malignancy in chest/abdomen/pelvis. 6/7>> CT venogram: Sinuses patent.  Significant microbiology data: None  Procedures: None  Consults: Neurology  Subjective: Lying comfortably in bed-denies any chest pain or shortness of breath.  No seizure events overnight.  Blood pressure was soft.  Objective: Vitals: Blood pressure (!) 81/57, pulse 78, temperature 98.2 F (36.8 C), temperature source Oral, resp. rate 19, SpO2 98%.   Exam: Gen Exam:Alert awake-not in any distress HEENT:atraumatic, normocephalic Chest: B/L clear to auscultation anteriorly CVS:S1S2 regular Abdomen:soft non tender, non distended Extremities:no edema Neurology: Non focal Skin: no rash  Pertinent Labs/Radiology:    Latest Ref Rng & Units 07/13/2023    5:33 AM 07/12/2023    2:57 PM 07/09/2023    4:07 AM  CBC  WBC 4.0 - 10.5 K/uL 5.5  6.2  4.8   Hemoglobin 12.0 - 15.0 g/dL 9.4  72.5  8.6   Hematocrit 36.0 - 46.0 % 29.4  33.3  27.6   Platelets 150 - 400 K/uL 163  185  177     Lab Results  Component Value Date   NA 137 07/13/2023   K 3.2 (L) 07/13/2023   CL 105 07/13/2023   CO2 26 07/13/2023    Assessment/Plan: New onset seizures No seizures  overnight Remains on Keppra  LTM EEG in progress MRI brain planned. Await further recommendation neurology service.  Chronic HFrEF Euvolemic lifestyle Due to soft blood pressure-beta-blocker/Lasix  held Continue Jardiance .  Paroxysmal atrial fibrillation-s/p multiple ablation S/p Watchman device in 2020 Continue aspirin . CHA2DS2-VASc of 3.  Hypokalemia Replete/recheck  History of multiple myeloma Reviewed notes from outpatient oncology-currently in remission.  History of peripheral neuropathy Continue Neurontin   Class 2 Obesity: Estimated body mass index is 35.66 kg/m as calculated from the following:   Height as of 07/08/23: 5\' 3"  (1.6 m).   Weight as of 07/08/23: 91.3 kg.   Code status:   Code Status: Full Code   DVT Prophylaxis: enoxaparin  (LOVENOX ) injection 40 mg Start: 07/12/23 1400   Family Communication: None at bedside   Disposition Plan: Status is: Observation The patient will require care spanning > 2 midnights and should be moved to inpatient because: Severity of illness   Planned Discharge Destination:Home   Diet: Diet Order             Diet regular Room service appropriate? Yes; Fluid consistency: Thin  Diet effective now                     Antimicrobial agents: Anti-infectives (From admission, onward)    None        MEDICATIONS: Scheduled Meds:  aspirin  EC  81 mg Oral Daily   enoxaparin  (LOVENOX ) injection  40 mg Subcutaneous Q24H   levETIRAcetam   500 mg Oral Q12H   potassium chloride   40 mEq Oral Once   Continuous Infusions: PRN Meds:.   I have personally reviewed following labs and imaging studies  LABORATORY DATA: CBC: Recent Labs  Lab 07/08/23 1208 07/09/23 0407 07/12/23 1457 07/13/23 0533  WBC 7.3 4.8 6.2 5.5  NEUTROABS 4.6  --   --   --   HGB 10.9* 8.6* 10.4* 9.4*  HCT 35.1* 27.6* 33.3* 29.4*  MCV 97.0 97.5 95.1 93.9  PLT 215 177 185 163    Basic Metabolic Panel: Recent Labs  Lab 07/08/23 1208  07/09/23 0407 07/12/23 1457 07/13/23 0533  NA 137 142  --  137  K 3.8 3.5  --  3.2*  CL 105 112*  --  105  CO2 18* 25  --  26  GLUCOSE 143* 91  --  83  BUN 17 15  --  16  CREATININE 1.24* 1.17* 1.45* 1.12*  CALCIUM 8.9 8.3*  --  7.8*    GFR: Estimated Creatinine Clearance: 58.8 mL/min (A) (by C-G formula based on SCr of 1.12 mg/dL (H)).  Liver Function Tests: Recent Labs  Lab 07/08/23 1208  AST 29  ALT 10  ALKPHOS 53  BILITOT 0.9  PROT 6.6  ALBUMIN 3.2*   No results for input(s): "LIPASE", "AMYLASE" in the last 168 hours. No results for input(s): "AMMONIA" in the last 168 hours.  Coagulation Profile: No results for input(s): "INR", "PROTIME" in the last 168 hours.  Cardiac Enzymes: No results for input(s): "CKTOTAL", "CKMB", "CKMBINDEX", "TROPONINI" in the last 168 hours.  BNP (last 3 results) No results for input(s): "PROBNP" in the last 8760 hours.  Lipid Profile: No results for input(s): "CHOL", "HDL", "LDLCALC", "TRIG", "CHOLHDL", "LDLDIRECT" in the last 72 hours.  Thyroid Function Tests: No results for input(s): "TSH", "T4TOTAL", "FREET4", "T3FREE", "THYROIDAB" in the last 72 hours.  Anemia Panel: Recent Labs    07/10/23 1806  VITAMINB12 585  FOLATE 18.9    Urine analysis:    Component Value Date/Time   COLORURINE YELLOW (A) 07/08/2023 1544   APPEARANCEUR HAZY (A) 07/08/2023 1544   APPEARANCEUR Clear 04/03/2013 1515   LABSPEC 1.026 07/08/2023 1544   LABSPEC 1.016 04/03/2013 1515   PHURINE 5.0 07/08/2023 1544   GLUCOSEU >=500 (A) 07/08/2023 1544   GLUCOSEU Negative 04/03/2013 1515   HGBUR SMALL (A) 07/08/2023 1544   BILIRUBINUR NEGATIVE 07/08/2023 1544   BILIRUBINUR Negative 04/03/2013 1515   KETONESUR NEGATIVE 07/08/2023 1544   PROTEINUR NEGATIVE 07/08/2023 1544   NITRITE NEGATIVE 07/08/2023 1544   LEUKOCYTESUR NEGATIVE 07/08/2023 1544   LEUKOCYTESUR Negative 04/03/2013 1515    Sepsis Labs: Lactic Acid, Venous    Component Value  Date/Time   LATICACIDVEN 0.9 07/13/2023 0533    MICROBIOLOGY: No results found for this or any previous visit (from the past 240 hours).  RADIOLOGY STUDIES/RESULTS: No results found.   LOS: 1 day   Kimberly Penna, MD  [facility]    To contact the attending provider between 7A-7P or the covering provider during after hours 7P-7A, please log into the web site [email or web address] and access using universal [place] password for that web site. If you do not have the password, please call the hospital operator.  07/13/2023, 9:07 AM

## 2023-07-13 NOTE — Plan of Care (Signed)
   Problem: Education: Goal: Knowledge of General Education information will improve Description Including pain rating scale, medication(s)/side effects and non-pharmacologic comfort measures Outcome: Progressing

## 2023-07-13 NOTE — Progress Notes (Signed)
   07/13/23 1513  TOC Brief Assessment  Insurance and Status Reviewed (Medicare A and B)  Patient has primary care physician Yes Caretha Chapel, Sionne A, MD)  Home environment has been reviewed from home  Prior level of function: independent  Prior/Current Home Services No current home services  Social Drivers of Health Review SDOH reviewed no interventions necessary  Readmission risk has been reviewed Yes (17%)  Transition of care needs no transition of care needs at this time   /TOC will continue to follow patient for any additional needs

## 2023-07-13 NOTE — Care Management Obs Status (Signed)
 MEDICARE OBSERVATION STATUS NOTIFICATION   Patient Details  Name: Carly Randall MRN: [id number] Date of Birth: Aug 24, 1965   Medicare Observation Status Notification Given:  Yes    Mindy Gali 07/13/2023, 6:45 AM

## 2023-07-13 NOTE — Significant Event (Signed)
 Patient's blood pressure is running low with systolic in the 80s.  Patient otherwise asymptomatic.  I reviewed patient's labs medications and notes.  Will hold patient's ARB and metoprolol for now.  Will order normal saline 250 cc bolus.  Check lactic acid.  Continue to monitor.  Carly Randall.

## 2023-07-13 NOTE — Progress Notes (Addendum)
 NEUROLOGY CONSULT FOLLOW UP NOTE   Date of service: July 13, 2023 Patient Name: Carly Randall MRN:  [id number] DOB:  03-Mar-1965  Interval Hx/subjective  No complaints.   Vitals   Vitals:   07/12/23 2329 07/13/23 0328 07/13/23 0803 07/13/23 0805  BP: (!) 82/52 (!) 85/51 (!) 81/50 (!) 81/57  Pulse: 74 82 79 78  Resp: 20 19 18 19   Temp: 98 F (36.7 C) 97.8 F (36.6 C) 98.2 F (36.8 C)   TempSrc: Oral Oral Oral   SpO2:  99% 98% 98%     There is no height or weight on file to calculate BMI.  Physical Exam   Physical Exam  HEENT:  [place]/AT Lungs: Respirations unlabored Extremities: Warm and well perfused.   Neurological Examination Mental Status: Awake, alert and oriented. Speech fluent with intact naming and comprehension. Flattened affect. No dysarthria. Cranial Nerves: II: Fixates and tracks normally.    III,IV, VI: No ptosis. EOMI. No nystagmus.  VII: Face is symmetric VIII: Hearing intact to conversation IX,X: No hoarseness or hypophonia XI: Symmetric Motor: Right :  Upper extremity   5/5                                      Left:     Upper extremity   5/5             Lower extremity   5/5                                                  Lower extremity   5/5 Cerebellar: No ataxia noted Gait: Deferred   Medications  Current Facility-Administered Medications:    aspirin  EC tablet 81 mg, 81 mg, Oral, Daily, Arne Langdon, MD   enoxaparin  (LOVENOX ) injection 40 mg, 40 mg, Subcutaneous, Q24H, Arne Langdon, MD   levETIRAcetam  (KEPPRA ) tablet 500 mg, 500 mg, Oral, Q12H, Arne Langdon, MD, 500 mg at 07/12/23 2112   potassium chloride  SA (KLOR-CON  M) CR tablet 40 mEq, 40 mEq, Oral, Once, Burton Casey, MD  Labs and Diagnostic Imaging   CBC:  Recent Labs  Lab 07/08/23 1208 07/09/23 0407 07/12/23 1457 07/13/23 0533  WBC 7.3   < > 6.2 5.5  NEUTROABS 4.6  --   --   --   HGB 10.9*   < > 10.4* 9.4*  HCT 35.1*   < > 33.3* 29.4*  MCV 97.0   < > 95.1  93.9  PLT 215   < > 185 163   < > = values in this interval not displayed.    Basic Metabolic Panel:  Lab Results  Component Value Date   NA 137 07/13/2023   K 3.2 (L) 07/13/2023   CO2 26 07/13/2023   GLUCOSE 83 07/13/2023   BUN 16 07/13/2023   CREATININE 1.12 (H) 07/13/2023   CALCIUM 7.8 (L) 07/13/2023   GFRNONAA 57 (L) 07/13/2023   GFRAA >60 04/03/2013   Lipid Panel: No results found for: "LDLCALC" HgbA1c: No results found for: "HGBA1C" Urine Drug Screen:     Component Value Date/Time   LABOPIA NONE DETECTED 07/08/2023 1544   COCAINSCRNUR NONE DETECTED 07/08/2023 1544   LABBENZ NONE DETECTED 07/08/2023 1544   AMPHETMU NONE DETECTED 07/08/2023 1544   THCU NONE  DETECTED 07/08/2023 1544   LABBARB NONE DETECTED 07/08/2023 1544      Assessment  Glenna Ira Busbin is a 58 y.o. 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  - Neurological exam is stable and nonfocal today  - Imaging: - CT head: No acute intracranial pathology.  - CTA of head and neck: No large vessel occlusion. No high-grade stenosis, aneurysm, or dissection of the arteries in the head and neck. No CTA findings to suggest CNS vasculitis. No acute intracranial hemorrhage.   - CTV: Possible small focus of soft tissue along the lateral aspect of the right sigmoid sinus with mild stenosis. However, findings are limited by contrast timing and artifact. Consider MR venogram head with contrast for further characterization. - EEGs: - EEG at [facility]: This study is within normal limits. No seizures were seen throughout the recording. Sharp transients were noted in right frontal region. Consider long term EEG if concern for ictal-interictal activity persists. - LTM EEG report for today: This study is within normal limits. No seizures were seen throughout the recording.  - Labs: RPR and HIV negative. WBC normal. B12 normal.  Folate normal. BNP elevated. Na and K normal. Ca mildly low at 8.3 in the context of low albumin of 3.2. BUN 15. Cr 1.17 with eGFR of 54.  - Impression: New onset GTC seizure activity. Work up is in progress.   Recommendations  - Continuing LTM EEG for one more day. Will discontinue on Wednesday if no seizures.  - Will need MRI brain after EEG (ordered). Per website this should be MRI conditional device on 1.5T or 3T scanner, but will need confirmation from technicians  MRV w/wo contrast has also been ordered.  - Inpatient seizure precautions.  - Outpatient seizure precautions: Per [place] statutes, patients with seizures are not allowed to drive until  they have been seizure-free for six months. Use caution when using heavy equipment or power tools. Avoid working on ladders or at heights. Take showers instead of baths. Ensure the water temperature is not too high on the home water heater. Do not go swimming alone. When caring for infants or small children, sit down when holding, feeding, or changing them to minimize risk of injury to the child in the event you have a seizure. Also, Maintain good sleep hygiene. Avoid alcohol. - Continue Keppra  500 mg BID.  ______________________________________________________________________   Hope Ly, Alejos Reinhardt, MD [facility]

## 2023-07-14 ENCOUNTER — Inpatient Hospital Stay

## 2023-07-14 ENCOUNTER — Inpatient Hospital Stay (HOSPITAL_COMMUNITY)

## 2023-07-14 DIAGNOSIS — G6289 Other specified polyneuropathies: Secondary | ICD-10-CM | POA: Diagnosis not present

## 2023-07-14 DIAGNOSIS — R569 Unspecified convulsions: Secondary | ICD-10-CM | POA: Diagnosis not present

## 2023-07-14 DIAGNOSIS — G4089 Other seizures: Secondary | ICD-10-CM | POA: Diagnosis not present

## 2023-07-14 DIAGNOSIS — I5020 Unspecified systolic (congestive) heart failure: Secondary | ICD-10-CM | POA: Diagnosis not present

## 2023-07-14 DIAGNOSIS — C9001 Multiple myeloma in remission: Secondary | ICD-10-CM | POA: Diagnosis not present

## 2023-07-14 MED ORDER — GADOBUTROL 1 MMOL/ML IV SOLN
9.0000 mL | Freq: Once | INTRAVENOUS | Status: AC | PRN
  Administered 2023-07-14: 9 mL via INTRAVENOUS

## 2023-07-14 MED ORDER — SODIUM CHLORIDE 0.9 % IV BOLUS
250.0000 mL | Freq: Once | INTRAVENOUS | Status: AC
  Administered 2023-07-14: 250 mL via INTRAVENOUS

## 2023-07-14 MED ORDER — MIDODRINE HCL 5 MG PO TABS
5.0000 mg | ORAL_TABLET | Freq: Three times a day (TID) | ORAL | Status: DC
  Administered 2023-07-14 (×3): 5 mg via ORAL
  Filled 2023-07-14 (×3): qty 1

## 2023-07-14 NOTE — Evaluation (Signed)
 Physical Therapy Evaluation Patient Details Name: [name] MRN: [id number] DOB: [birth year] Today's Date: 07/14/2023  History of Present Illness  Pt is 58 year old presented to [facility] on 07/08/23 after witnessed seizure. Transferred to [facility] on  07/10/23 for long term EEG. PMH - chf, ICD, multiple myeloma in remission, afib  Clinical Impression  Pt presents to PT with slightly unsteady gait due to illness and inactivity. Expect pt will make good progress back to baseline with mobility. Will follow acutely but doubt pt will need PT after DC.          If plan is discharge home, recommend the following: Assist for transportation   Can travel by private vehicle        Equipment Recommendations None recommended by PT  Recommendations for Other Services       Functional Status Assessment Patient has had a recent decline in their functional status and demonstrates the ability to make significant improvements in function in a reasonable and predictable amount of time.     Precautions / Restrictions        Mobility  Bed Mobility Overal bed mobility: Modified Independent                  Transfers Overall transfer level: Modified independent Equipment used: None                    Ambulation/Gait Ambulation/Gait assistance: Contact guard assist Gait Distance (Feet): 200 Feet Assistive device: None Gait Pattern/deviations: Step-through pattern, Decreased stride length, Drifts right/left Gait velocity: decr Gait velocity interpretation: 1.31 - 2.62 ft/sec, indicative of limited community ambulator   General Gait Details: Slightly unsteady but no overt loss of balance  Stairs            Wheelchair Mobility     Tilt Bed    Modified Rankin (Stroke Patients Only)       Balance Overall balance assessment: Mild deficits observed, not formally tested                                           Pertinent Vitals/Pain Pain  Assessment Pain Assessment: No/denies pain    Home Living Family/patient expects to be discharged to:: Private residence Living Arrangements: Other relatives (brother) Available Help at Discharge: Family;Available PRN/intermittently (Brother not around much) Type of Home: House Home Access: Stairs to enter Entrance Stairs-Rails: Left Entrance Stairs-Number of Steps: 2   Home Layout: One level Home Equipment: Agricultural consultant (2 wheels);Rollator (4 wheels)      Prior Function Prior Level of Function : Independent/Modified Independent             Mobility Comments: No assistive device       Extremity/Trunk Assessment   Upper Extremity Assessment Upper Extremity Assessment: Defer to OT evaluation    Lower Extremity Assessment Lower Extremity Assessment: Overall WFL for tasks assessed       Communication   Communication Communication: No apparent difficulties    Cognition Arousal: Alert Behavior During Therapy: WFL for tasks assessed/performed   PT - Cognitive impairments: No apparent impairments                         Following commands: Intact       Cueing Cueing Techniques: Verbal cues     General Comments General comments (skin integrity, edema, etc.):  VSS on RA    Exercises     Assessment/Plan    PT Assessment Patient needs continued PT services  PT Problem List Decreased balance;Decreased mobility       PT Treatment Interventions DME instruction;Gait training;Stair training;Functional mobility training;Therapeutic activities;Therapeutic exercise;Balance training;Patient/family education    PT Goals (Current goals can be found in the Care Plan section)  Acute Rehab PT Goals Patient Stated Goal: return home PT Goal Formulation: With patient Time For Goal Achievement: 07/21/23 Potential to Achieve Goals: Good    Frequency Min 2X/week     Co-evaluation               AM-PAC PT 6 Clicks Mobility  Outcome Measure Help  needed turning from your back to your side while in a flat bed without using bedrails?: None Help needed moving from lying on your back to sitting on the side of a flat bed without using bedrails?: None Help needed moving to and from a bed to a chair (including a wheelchair)?: A Little Help needed standing up from a chair using your arms (e.g., wheelchair or bedside chair)?: None Help needed to walk in hospital room?: A Little Help needed climbing 3-5 steps with a railing? : A Little 6 Click Score: 21    End of Session Equipment Utilized During Treatment: Gait belt Activity Tolerance: Patient tolerated treatment well Patient left: in bed;with call bell/phone within reach;with bed alarm set Nurse Communication: Mobility status PT Visit Diagnosis: Other abnormalities of gait and mobility (R26.89)    Time: 9323-5573 PT Time Calculation (min) (ACUTE ONLY): 19 min   Charges:   PT Evaluation $PT Eval Low Complexity: 1 Low   PT General Charges $$ ACUTE PT VISIT: 1 Visit         [facility] PT Acute Rehabilitation Services Office [phone]   Pura Browns [facility] 07/14/2023, 12:39 PM

## 2023-07-14 NOTE — Plan of Care (Signed)
  Problem: Education: Goal: Knowledge of General Education information will improve Description: Including pain rating scale, medication(s)/side effects and non-pharmacologic comfort measures Outcome: Progressing   Problem: Clinical Measurements: Goal: Diagnostic test results will improve Outcome: Progressing   Problem: Coping: Goal: Level of anxiety will decrease Outcome: Progressing   Problem: Safety: Goal: Ability to remain free from injury will improve Outcome: Progressing   

## 2023-07-14 NOTE — Evaluation (Signed)
 Occupational Therapy Evaluation Patient Details Name: Carly Randall MRN: [id number] DOB: 09/13/1965 Today's Date: 07/14/2023   History of Present Illness   Pt is 58 year old presented to [facility] on 07/08/23 after witnessed seizure. Transferred to [facility] on  07/10/23 for long term EEG. PMH - chf, ICD, multiple myeloma in remission, afib     Clinical Impressions Pt c/o new onset of stuttering, noticed a couple times throughout session, and c/o mild fatigue. Pt lives at home with brother, PLOF independent, has mother and cousin close by if needed. Pt currently close to baseline, completed ADLs and ambulation around room at mod I, no LOB, able to fully participate in therapy. Pt at this time has no further acute OT needs, no follow up OT needed, no DME needs, consulted speech for new stutter.      If plan is discharge home, recommend the following:   Assist for transportation     Functional Status Assessment   Patient has had a recent decline in their functional status and demonstrates the ability to make significant improvements in function in a reasonable and predictable amount of time.     Equipment Recommendations   None recommended by OT     Recommendations for Other Services   Speech consult     Precautions/Restrictions   Precautions Precautions: Fall Recall of Precautions/Restrictions: Intact Restrictions Weight Bearing Restrictions Per Provider Order: No     Mobility Bed Mobility Overal bed mobility: Modified Independent                  Transfers Overall transfer level: Modified independent                        Balance Overall balance assessment: Mild deficits observed, not formally tested                                         ADL either performed or assessed with clinical judgement   ADL Overall ADL's : Modified independent;At baseline                                             Vision  Baseline Vision/History: 1 Wears glasses Ability to See in Adequate Light: 0 Adequate       Perception         Praxis         Pertinent Vitals/Pain Pain Assessment Pain Assessment: No/denies pain     Extremity/Trunk Assessment Upper Extremity Assessment Upper Extremity Assessment: Overall WFL for tasks assessed   Lower Extremity Assessment Lower Extremity Assessment: Defer to PT evaluation       Communication Communication Communication: No apparent difficulties   Cognition Arousal: Alert Behavior During Therapy: WFL for tasks assessed/performed Cognition: No apparent impairments                               Following commands: Intact       Cueing  General Comments   Cueing Techniques: Verbal cues  VSS on RA   Exercises     Shoulder Instructions      Home Living Family/patient expects to be discharged to:: Private residence Living Arrangements: Other relatives (brother) Available Help at Discharge: Family;Available  PRN/intermittently Type of Home: House Home Access: Stairs to enter [employer] of Steps: 2 Entrance Stairs-Rails: Left Home Layout: One level     Bathroom Shower/Tub: Walk-in shower         Home Equipment: Agricultural consultant (2 wheels);Rollator (4 wheels);Cane - single point;Shower seat - built in   Additional Comments: Pt's brother lives with her, mother lives down the street, has cousin next door.      Prior Functioning/Environment Prior Level of Function : Independent/Modified Independent             Mobility Comments: No assistive device      OT Problem List: Decreased activity tolerance   OT Treatment/Interventions:        OT Goals(Current goals can be found in the care plan section)   Acute Rehab OT Goals Patient Stated Goal: to return home OT Goal Formulation: With patient/family Time For Goal Achievement: 07/28/23 Potential to Achieve Goals: Good   OT Frequency:        Co-evaluation              AM-PAC OT 6 Clicks Daily Activity     Outcome Measure Help from another person eating meals?: None Help from another person taking care of personal grooming?: None Help from another person toileting, which includes using toliet, bedpan, or urinal?: None Help from another person bathing (including washing, rinsing, drying)?: None Help from another person to put on and taking off regular upper body clothing?: None Help from another person to put on and taking off regular lower body clothing?: None 6 Click Score: 24   End of Session Nurse Communication: Mobility status  Activity Tolerance: Patient tolerated treatment well Patient left: in bed;with call bell/phone within reach;with bed alarm set;with family/visitor present  OT Visit Diagnosis: Other abnormalities of gait and mobility (R26.89);Other (comment) (decreased activity tolerance)                Time: 1610-9604 OT Time Calculation (min): 16 min Charges:  OT General Charges $OT Visit: 1 Visit OT Evaluation $OT Eval Low Complexity: 1 Low  [address] OTR/L   Scherry Curtis 07/14/2023, 2:33 PM

## 2023-07-14 NOTE — Plan of Care (Signed)
 Patient had no seizures just a headache and she was given Tylenol .

## 2023-07-14 NOTE — Progress Notes (Signed)
 PROGRESS NOTE        PATIENT DETAILS Name: Carly Randall Age: 58 y.o. Sex: female Date of Birth: [birth year] Admit Date: 07/12/2023 Admitting Physician Tylene Galla Donzell Gallery, MD ZOX:[id number], Brien Can, MD  Brief Summary: Patient is a 58 y.o.  female with history of HFrEF-s/p ICD implantation, PAF-s/p Watchman device, multiple myeloma (in remission)-presented as a transfer from [facility] for LTM EEG/MRI-after she was found to have new onset seizures.  Significant events: 6/9>> admit to [facility]-transfer from [facility] for LTM EEG/MRI brain.  Significant studies: 6/5>> CT head: No acute intracranial abnormality 6 6>> EEG: No seizures. 6/7>> CTA head/neck: No LVO-no high-grade stenosis in head/neck. 6/7>> CT chest/abdomen/pelvis: No evidence of malignancy in chest/abdomen/pelvis. 6/7>> CT venogram: Sinuses patent. 6/9-6/10>> LTM EEG: No seizures 6/10-6/11>> LTM EEG: No seizures  Significant microbiology data: None  Procedures: None  Consults: Neurology  Subjective: No complaints-lying comfortably in bed.  Objective: Vitals: Blood pressure (!) 93/56, pulse 82, temperature 97.9 F (36.6 C), temperature source Oral, resp. rate 18, SpO2 100%.   Exam: Awake/alert Chest: Clear to auscultation CVS: S1-S2 regular Extremities: No edema Abdomen: Soft nontender nondistended Nonfocal exam but with generalized weakness.  Pertinent Labs/Radiology:    Latest Ref Rng & Units 07/13/2023    5:33 AM 07/12/2023    2:57 PM 07/09/2023    4:07 AM  CBC  WBC 4.0 - 10.5 K/uL 5.5  6.2  4.8   Hemoglobin 12.0 - 15.0 g/dL 9.4  40.9  8.6   Hematocrit 36.0 - 46.0 % 29.4  33.3  27.6   Platelets 150 - 400 K/uL 163  185  177     Lab Results  Component Value Date   NA 137 07/13/2023   K 3.2 (L) 07/13/2023   CL 105 07/13/2023   CO2 26 07/13/2023    Assessment/Plan: New onset seizures No seizures overnight Remains on Keppra  LTM EEG x 48 hours negative for seizures MRI/MRV  brain scheduled for later today. Neurology following.  Chronic HFrEF Euvolemic lifestyle Due to soft blood pressure-beta-blocker/Lasix  held-low-dose today and initiated Continue Jardiance .  Paroxysmal atrial fibrillation-s/p multiple ablation S/p Watchman device in 2020 Continue aspirin . CHA2DS2-VASc of 3.  Hypokalemia Repleted  History of multiple myeloma Reviewed notes from outpatient oncology-currently in remission.  History of peripheral neuropathy Continue Neurontin   Debility/deconditioning PT/OT eval ordered.  Class 2 Obesity: Estimated body mass index is 35.66 kg/m as calculated from the following:   Height as of 07/08/23: 5' 3 (1.6 m).   Weight as of 07/08/23: 91.3 kg.   Code status:   Code Status: Full Code   DVT Prophylaxis: enoxaparin  (LOVENOX ) injection 40 mg Start: 07/12/23 1400   Family Communication: Mother-Carly Randall-[phone] updated 6/11   Disposition Plan: Status is: Observation The patient will require care spanning > 2 midnights and should be moved to inpatient because: Severity of illness   Planned Discharge Destination:Home   Diet: Diet Order             Diet regular Room service appropriate? Yes; Fluid consistency: Thin  Diet effective now                     Antimicrobial agents: Anti-infectives (From admission, onward)    None        MEDICATIONS: Scheduled Meds:  aspirin  EC  81 mg Oral Daily   empagliflozin   10 mg  Oral Daily   enoxaparin  (LOVENOX ) injection  40 mg Subcutaneous Q24H   gabapentin   100 mg Oral TID   levETIRAcetam   500 mg Oral Q12H   midodrine  5 mg Oral TID WC   Continuous Infusions: PRN Meds:.acetaminophen    I have personally reviewed following labs and imaging studies  LABORATORY DATA: CBC: Recent Labs  Lab 07/08/23 1208 07/09/23 0407 07/12/23 1457 07/13/23 0533  WBC 7.3 4.8 6.2 5.5  NEUTROABS 4.6  --   --   --   HGB 10.9* 8.6* 10.4* 9.4*  HCT 35.1* 27.6* 33.3* 29.4*  MCV 97.0 97.5  95.1 93.9  PLT 215 177 185 163    Basic Metabolic Panel: Recent Labs  Lab 07/08/23 1208 07/09/23 0407 07/12/23 1457 07/13/23 0533  NA 137 142  --  137  K 3.8 3.5  --  3.2*  CL 105 112*  --  105  CO2 18* 25  --  26  GLUCOSE 143* 91  --  83  BUN 17 15  --  16  CREATININE 1.24* 1.17* 1.45* 1.12*  CALCIUM 8.9 8.3*  --  7.8*    GFR: Estimated Creatinine Clearance: 58.8 mL/min (A) (by C-G formula based on SCr of 1.12 mg/dL (H)).  Liver Function Tests: Recent Labs  Lab 07/08/23 1208  AST 29  ALT 10  ALKPHOS 53  BILITOT 0.9  PROT 6.6  ALBUMIN 3.2*   No results for input(s): LIPASE, AMYLASE in the last 168 hours. No results for input(s): AMMONIA in the last 168 hours.  Coagulation Profile: No results for input(s): INR, PROTIME in the last 168 hours.  Cardiac Enzymes: No results for input(s): CKTOTAL, CKMB, CKMBINDEX, TROPONINI in the last 168 hours.  BNP (last 3 results) No results for input(s): PROBNP in the last 8760 hours.  Lipid Profile: No results for input(s): CHOL, HDL, LDLCALC, TRIG, CHOLHDL, LDLDIRECT in the last 72 hours.  Thyroid Function Tests: No results for input(s): TSH, T4TOTAL, FREET4, T3FREE, THYROIDAB in the last 72 hours.  Anemia Panel: No results for input(s): VITAMINB12, FOLATE, FERRITIN, TIBC, IRON, RETICCTPCT in the last 72 hours.   Urine analysis:    Component Value Date/Time   COLORURINE YELLOW (A) 07/08/2023 1544   APPEARANCEUR HAZY (A) 07/08/2023 1544   APPEARANCEUR Clear 04/03/2013 1515   LABSPEC 1.026 07/08/2023 1544   LABSPEC 1.016 04/03/2013 1515   PHURINE 5.0 07/08/2023 1544   GLUCOSEU >=500 (A) 07/08/2023 1544   GLUCOSEU Negative 04/03/2013 1515   HGBUR SMALL (A) 07/08/2023 1544   BILIRUBINUR NEGATIVE 07/08/2023 1544   BILIRUBINUR Negative 04/03/2013 1515   KETONESUR NEGATIVE 07/08/2023 1544   PROTEINUR NEGATIVE 07/08/2023 1544   NITRITE NEGATIVE 07/08/2023 1544    LEUKOCYTESUR NEGATIVE 07/08/2023 1544   LEUKOCYTESUR Negative 04/03/2013 1515    Sepsis Labs: Lactic Acid, Venous    Component Value Date/Time   LATICACIDVEN 0.9 07/13/2023 0533    MICROBIOLOGY: No results found for this or any previous visit (from the past 240 hours).  RADIOLOGY STUDIES/RESULTS: Overnight EEG with video Result Date: 07/13/2023 Arleene Lack, MD     07/14/2023  9:05 AM Patient Name: Jamile Rekowski MRN: [id number] Epilepsy Attending: Arleene Lack Referring Physician/Provider: Kimberley Penman, MD Duration: 07/12/2023 1231 to 07/13/2023 1231  Patient history: 58 y.o. female was brought to the ED today after a witnessed seizure. EEG to evaluate for seizure.  Level of alertness: Awake, asleep  AEDs during EEG study: LEV, GBP  Technical aspects: This EEG study was done with  scalp electrodes positioned according to the 10-20 International system of electrode placement. Electrical activity was reviewed with band pass filter of 1-70Hz , sensitivity of 7 uV/mm, display speed of 18mm/sec with a 60Hz  notched filter applied as appropriate. EEG data were recorded continuously and digitally stored.  Video monitoring was available and reviewed as appropriate.  Description: The posterior dominant rhythm consists of 9 Hz activity of moderate voltage (25-35 uV) seen predominantly in posterior head regions, symmetric and reactive to eye opening and eye closing. Sleep was characterized by vertex waves, sleep spindles (12 to 14 Hz), maximal frontocentral region. Hyperventilation and photic stimulation were not performed.    IMPRESSION: This study is within normal limits. No seizures were seen throughout the recording. Priyanka Suzanne Erps     LOS: 2 days   Kimberly Penna, MD  [facility]    To contact the attending provider between 7A-7P or the covering provider during after hours 7P-7A, please log into the web site [email or web address] and access using universal [place] password for  that web site. If you do not have the password, please call the hospital operator.  07/14/2023, 10:08 AM

## 2023-07-14 NOTE — Procedures (Addendum)
 Patient Name: Carly Randall  MRN: [id number]  Epilepsy Attending: Arleene Lack  Referring Physician/Provider: Kimberley Penman, MD  Duration: 07/13/2023 1231 to 07/14/2023 2725   Patient history: 58 y.o. female was brought to the ED today after a witnessed seizure. EEG to evaluate for seizure.   Level of alertness: Awake, asleep   AEDs during EEG study: LEV, GBP   Technical aspects: This EEG study was done with scalp electrodes positioned according to the 10-20 International system of electrode placement. Electrical activity was reviewed with band pass filter of 1-70Hz , sensitivity of 7 uV/mm, display speed of 4mm/sec with a 60Hz  notched filter applied as appropriate. EEG data were recorded continuously and digitally stored.  Video monitoring was available and reviewed as appropriate.   Description: The posterior dominant rhythm consists of 9 Hz activity of moderate voltage (25-35 uV) seen predominantly in posterior head regions, symmetric and reactive to eye opening and eye closing. Sleep was characterized by vertex waves, sleep spindles (12 to 14 Hz), maximal frontocentral region. Hyperventilation and photic stimulation were not performed.     EEG was disconnected between 07/14/2023 0644 to 0812 due to technical difficulty    IMPRESSION: This study is within normal limits. No seizures were seen throughout the recording.   Merton Wadlow O Carly Randall

## 2023-07-14 NOTE — Progress Notes (Signed)
  Device system confirmed to be MRI conditional, with implant date > 6 weeks ago, and no evidence of abandoned or epicardial leads in review of most recent CXR  Device last cleared by EP Provider: Lyle San NP  Clearance is good through for 1 year as long as parameters remain stable at time of check. If pt undergoes a cardiac device procedure during that time, they should be re-cleared.   Tachy-therapies to be programmed off if applicable with device back to pre-MRI settings after completion of exam.  [employer] was available remotely to assist in programming recommendations.   Quentin Brunner, [place]  07/14/2023 12:28 PM

## 2023-07-14 NOTE — Progress Notes (Signed)
 SLP Cancellation Note  Patient Details Name: Carly Randall MRN: [id number] DOB: 1965-05-19   Cancelled treatment:       Reason Eval/Treat Not Completed: Patient at procedure or test/unavailable.    Analuisa Tudor, Hardin Leys 07/14/2023, 3:18 PM

## 2023-07-14 NOTE — Progress Notes (Signed)
 NEUROLOGY CONSULT FOLLOW UP NOTE   Date of service: July 14, 2023 Patient Name: Carly Randall MRN:  [id number] DOB:  1965-10-12  Interval Hx/subjective  States that she feels well this AM and is hoping to be discharged home soon.   Vitals   Vitals:   07/13/23 1926 07/13/23 2345 07/14/23 0334 07/14/23 0819  BP: (!) 86/72 (!) 86/57 (!) 76/44 (!) 93/56  Pulse: 98 75 73 82  Resp: (!) 21 16 15 18   Temp: 98.1 F (36.7 C) 97.9 F (36.6 C) 97.6 F (36.4 C) 97.9 F (36.6 C)  TempSrc: Oral Oral Oral Oral  SpO2: 100% 100% 100% 100%     There is no height or weight on file to calculate BMI.  Physical Exam   Physical Exam  HEENT:  [place]/AT Lungs: Respirations unlabored Extremities: Warm and well perfused.    Neurological Examination Mental Status: Awake, alert and fully oriented. Speech fluent with intact naming and comprehension. Flattened affect. No dysarthria. Cranial Nerves: II: Fixates and tracks normally.    III,IV, VI: No ptosis. EOMI. No nystagmus.  VII: Face is symmetric VIII: Hearing intact to conversation IX,X: No hoarseness or hypophonia XI: Symmetric Motor: Right :  Upper extremity   5/5                                      Left:     Upper extremity   5/5             Lower extremity   5/5                                                  Lower extremity   5/5 Cerebellar: No ataxia noted Gait: Deferred  Medications  Current Facility-Administered Medications:    acetaminophen  (TYLENOL ) tablet 650 mg, 650 mg, Oral, Q6H PRN, Ghimire, Estil Heman, MD, 650 mg at 07/13/23 2146   aspirin  EC tablet 81 mg, 81 mg, Oral, Daily, Arne Langdon, MD, 81 mg at 07/14/23 4098   empagliflozin  (JARDIANCE ) tablet 10 mg, 10 mg, Oral, Daily, Ghimire, Estil Heman, MD, 10 mg at 07/14/23 1191   enoxaparin  (LOVENOX ) injection 40 mg, 40 mg, Subcutaneous, Q24H, Arne Langdon, MD, 40 mg at 07/13/23 1530   gabapentin  (NEURONTIN ) capsule 100 mg, 100 mg, Oral, TID, Ghimire, Estil Heman, MD, 100  mg at 07/14/23 4782   levETIRAcetam  (KEPPRA ) tablet 500 mg, 500 mg, Oral, Q12H, Arne Langdon, MD, 500 mg at 07/14/23 9562   midodrine (PROAMATINE) tablet 5 mg, 5 mg, Oral, TID WC, Ghimire, Estil Heman, MD, 5 mg at 07/14/23 0822  Labs and Diagnostic Imaging   CBC:  Recent Labs  Lab 07/08/23 1208 07/09/23 0407 07/12/23 1457 07/13/23 0533  WBC 7.3   < > 6.2 5.5  NEUTROABS 4.6  --   --   --   HGB 10.9*   < > 10.4* 9.4*  HCT 35.1*   < > 33.3* 29.4*  MCV 97.0   < > 95.1 93.9  PLT 215   < > 185 163   < > = values in this interval not displayed.    Basic Metabolic Panel:  Lab Results  Component Value Date   NA 137 07/13/2023   K 3.2 (L) 07/13/2023   CO2 26  07/13/2023   GLUCOSE 83 07/13/2023   BUN 16 07/13/2023   CREATININE 1.12 (H) 07/13/2023   CALCIUM 7.8 (L) 07/13/2023   GFRNONAA 57 (L) 07/13/2023   GFRAA >60 04/03/2013   Lipid Panel: No results found for: LDLCALC HgbA1c: No results found for: HGBA1C Urine Drug Screen:     Component Value Date/Time   LABOPIA NONE DETECTED 07/08/2023 1544   COCAINSCRNUR NONE DETECTED 07/08/2023 1544   LABBENZ NONE DETECTED 07/08/2023 1544   AMPHETMU NONE DETECTED 07/08/2023 1544   THCU NONE DETECTED 07/08/2023 1544   LABBARB NONE DETECTED 07/08/2023 1544      Assessment  Carly Randall is a 58 y.o. 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   - Neurological exam continues to be stable and nonfocal today  - Imaging: - CT head: No acute intracranial pathology.  - CTA of head and neck: No large vessel occlusion. No high-grade stenosis, aneurysm, or dissection of the arteries in the head and neck. No CTA findings to suggest CNS vasculitis. No acute intracranial hemorrhage.   - CTV: Possible small focus of soft tissue along the lateral aspect of the right sigmoid sinus with mild stenosis. However, findings are limited by contrast timing and artifact. Consider MR venogram head with contrast  for further characterization. - EEGs: - [facility] at [facility]: This study is within normal limits. No seizures were seen throughout the recording. Sharp transients were noted in right frontal region. Consider long term EEG if concern for ictal-interictal activity persists. - LTM EEG report for today: This study is within normal limits. No seizures were seen throughout the recording.  - Labs: RPR and HIV negative. WBC normal. B12 normal. Folate normal. BNP elevated. Na and K normal. Ca mildly low at 8.3 in the context of low albumin of 3.2. BUN 15. Cr 1.17 with eGFR of 54.  - Impression: New onset GTC seizure activity. Work up is in progress.   Recommendations  - LTM EEG is being discontinued - Will need MRI brain after EEG (ordered). Per website this should be MRI conditional device on 1.5T or 3T scanner, but will need confirmation from technicians  MRV w/wo contrast has also been ordered.  - Inpatient seizure precautions.  - Outpatient seizure precautions: Per [place] statutes, patients with seizures are not allowed to drive until  they have been seizure-free for six months. Use caution when using heavy equipment or power tools. Avoid working on ladders or at heights. Take showers instead of baths. Ensure the water temperature is not too high on the home water heater. Do not go swimming alone. When caring for infants or small children, sit down when holding, feeding, or changing them to minimize risk of injury to the child in the event you have a seizure. Also, Maintain good sleep hygiene. Avoid alcohol. - Continue Keppra  500 mg BID.    Addendum: - MRI brain and MRV w/wo contrast: 1. Normal brain MRI. No acute intracranial abnormality or findings to explain patient's symptoms. 2. Normal intracranial MRV. No evidence for dural venous sinus thrombosis. No finding to correlate with previously questioned abnormality at the right sigmoid sinus. This was presumably artifactual. - Neurological work  up is complete. Continue Keppra  at 500 mg BID. Will need outpatient Neurology follow up.  - Neurohospitalist service will sign off. Please call if there are additional questions.  ______________________________________________________________________   Hope Ly, Vercie Pokorny, MD [facility]

## 2023-07-14 NOTE — Progress Notes (Signed)
 vLTM maintenance  All impedances below 10kohms.  No skin breakdown noted at FP1  FP2  A1  A2

## 2023-07-14 NOTE — Progress Notes (Signed)
 vLTM discontinued  No skin breakdown at disconnect.  [facility] notiified

## 2023-07-14 NOTE — TOC Progression Note (Signed)
 Transition of Care [facility]) - Progression Note    Patient Details  Name: Carly Randall MRN: [id number] Date of Birth: 04/21/1965  [facility] CM/SW Contact  Eusebio High, RN Phone Number: 07/14/2023, 3:26 PM  Clinical Narrative:     There is no recommendation for therapy follow up per evals. Patient MAY need a voucher for transportation at DC. Will follow          Expected Discharge Plan and Services                                               Social Determinants of Health (SDOH) Interventions SDOH Screenings   Food Insecurity: No Food Insecurity (07/12/2023)  Housing: Unknown (07/12/2023)  Transportation Needs: No Transportation Needs (07/12/2023)  Utilities: Not At Risk (07/12/2023)  Recent Concern: Utilities - High Risk (05/31/2023)   Received from [facility]  Financial Resource Strain: High Risk (05/31/2023)   Received from [facility]  Physical Activity: Sufficiently Active (07/17/2022)   Received from [facility]  Social Connections: Moderately Isolated (07/17/2022)   Received from [facility]  Stress: No Stress Concern Present (07/17/2022)   Received from [facility]  Tobacco Use: Low Risk  (07/08/2023)  Health Literacy: Low Risk  (10/17/2022)   Received from [facility]    Readmission Risk Interventions     No data to display

## 2023-07-15 DIAGNOSIS — I5020 Unspecified systolic (congestive) heart failure: Secondary | ICD-10-CM | POA: Diagnosis not present

## 2023-07-15 DIAGNOSIS — I4891 Unspecified atrial fibrillation: Secondary | ICD-10-CM | POA: Diagnosis not present

## 2023-07-15 DIAGNOSIS — R569 Unspecified convulsions: Secondary | ICD-10-CM | POA: Diagnosis not present

## 2023-07-15 LAB — BASIC METABOLIC PANEL WITH GFR
Anion gap: 9 (ref 5–15)
BUN: 17 mg/dL (ref 6–20)
CO2: 25 mmol/L (ref 22–32)
Calcium: 8.6 mg/dL — ABNORMAL LOW (ref 8.9–10.3)
Chloride: 104 mmol/L (ref 98–111)
Creatinine, Ser: 1.08 mg/dL — ABNORMAL HIGH (ref 0.44–1.00)
GFR, Estimated: 60 mL/min — ABNORMAL LOW (ref >60)
Glucose, Bld: 89 mg/dL (ref 70–99)
Potassium: 3.8 mmol/L (ref 3.5–5.1)
Sodium: 138 mmol/L (ref 135–145)

## 2023-07-15 LAB — MAGNESIUM: Magnesium: 1.3 mg/dL — ABNORMAL LOW (ref 1.7–2.4)

## 2023-07-15 MED ORDER — LEVETIRACETAM 500 MG PO TABS: 500.0000 mg | ORAL_TABLET | Freq: Two times a day (BID) | ORAL | 1 refills | Status: AC

## 2023-07-15 MED ORDER — MIDODRINE HCL 5 MG PO TABS: 5.0000 mg | ORAL_TABLET | Freq: Three times a day (TID) | ORAL | 0 refills | Status: AC

## 2023-07-15 MED ORDER — METOPROLOL SUCCINATE ER 25 MG PO TB24
12.5000 mg | ORAL_TABLET | Freq: Every day | ORAL | Status: DC
  Administered 2023-07-15: 12.5 mg via ORAL
  Filled 2023-07-15: qty 1

## 2023-07-15 MED ORDER — METOPROLOL SUCCINATE ER 25 MG PO TB24: 12.5000 mg | ORAL_TABLET | Freq: Every day | ORAL | 1 refills | Status: AC

## 2023-07-15 MED ORDER — MIDODRINE HCL 5 MG PO TABS
10.0000 mg | ORAL_TABLET | Freq: Three times a day (TID) | ORAL | Status: DC
  Administered 2023-07-15: 10 mg via ORAL
  Filled 2023-07-15: qty 2

## 2023-07-15 NOTE — TOC Transition Note (Addendum)
 Transition of Care [facility]) - Discharge Note   Patient Details  Name: Carly Randall MRN: [id number] Date of Birth: [birth year]  [facility] of [facility]) CM/SW Contact:  Eusebio High, RN Phone Number: 07/15/2023, 9:50 AM   Clinical Narrative:    Patient will DC to home today No TOC need identified  Patient will follow up as directed on AVS    UPDATE:  OPSLP  REFERRED PER RECOMMENDATIONS           Patient Goals and CMS Choice            Discharge Placement                       Discharge Plan and Services Additional resources added to the After Visit Summary for                                       Social Drivers of Health (SDOH) Interventions SDOH Screenings   Food Insecurity: No Food Insecurity (07/12/2023)  Housing: Unknown (07/12/2023)  Transportation Needs: No Transportation Needs (07/12/2023)  Utilities: Not At Risk (07/12/2023)  Recent Concern: Utilities - High Risk (05/31/2023)   Received from [facility]  Financial Resource Strain: High Risk (05/31/2023)   Received from [facility]  Physical Activity: Sufficiently Active (07/17/2022)   Received from [facility]  Social Connections: Moderately Isolated (07/17/2022)   Received from [facility]  Stress: No Stress Concern Present (07/17/2022)   Received from [facility]  Tobacco Use: Low Risk  (07/08/2023)  Health Literacy: Low Risk  (10/17/2022)   Received from [facility]     Readmission Risk Interventions     No data to display

## 2023-07-15 NOTE — Discharge Summary (Signed)
 PATIENT DETAILS Name: Carly Randall Age: 58 y.o. Sex: female Date of Birth: 09/04/1965 MRN: [id number]. Admitting Physician: Burton Casey, MD WUJ:[id number], Steen Eden, MD  Admit Date: 07/12/2023 Discharge date: 07/15/2023  Recommendations for Outpatient Follow-up:  Follow up with PCP in 1-2 weeks Please obtain CMP/CBC in one week Please ensure follow-up with neurology, cardiology.  Admitted From:  Home  Disposition: Home   Discharge Condition: good  CODE STATUS:   Code Status: Full Code   Diet recommendation:  Diet Order             Diet - low sodium heart healthy           Diet regular Room service appropriate? Yes; Fluid consistency: Thin  Diet effective now                    Brief Summary: Patient is a 58 y.o.  female with history of HFrEF-s/p ICD implantation, PAF-s/p Watchman device, multiple myeloma (in remission)-[facility] EEG/MRI-after she was found to have new onset seizures.   Significant events: 6/9>> admit to [facility]-transfer from [facility] for LTM EEG/MRI brain.   Significant studies: 6/5>> CT head: No acute intracranial abnormality 6 6>> EEG: No seizures. 6/7>> CTA head/neck: No LVO-no high-grade stenosis in head/neck. 6/7>> CT chest/abdomen/pelvis: No evidence of malignancy in chest/abdomen/pelvis. 6/7>> CT venogram: Sinuses patent. 6/9-6/10>> LTM EEG: No seizures 6/10-6/11>> LTM EEG: No seizures 6/11>> MRI brain: No acute intracranial abnormality 6/11>> MRV brain: No evidence of dural venous sinus thrombosis.   Significant microbiology data: None   Procedures: None   Consults: Neurology  Brief Hospital Course: New onset seizures No seizures overnight Remains on Keppra  LTM EEG x 48 hours negative for seizures Extensive neuroimaging negative-see above Recommendations from neurology are to continue Keppra  on discharge-and have patient follow-up with outpatient neurologist Patient aware of seizure  restrictions including driving.   Chronic HFrEF Remains euvolemic on exam Blood pressure has been soft Has been started on low-dose midodrine Beta-blocker dosage has been decreased to 12.5 mg Continue Jardiance  Follow-up with cardiologist-I have asked patient to make an appointment in the next week or so.  Upon follow-up with cardiology-other GDMT medications can be slowly restarted back.   Paroxysmal atrial fibrillation-s/p multiple ablation S/p Watchman device in 2020 Continue aspirin . CHA2DS2-VASc of 3.   Hypokalemia Repleted   History of multiple myeloma Reviewed notes from outpatient oncology-currently in remission.   History of peripheral neuropathy Continue Neurontin    Debility/deconditioning PT/OT eval -not felt to require any further therapies..   Class 2 Obesity: Estimated body mass index is 35.66 kg/m as calculated from the following:   Height as of 07/08/23: 5' 3 (1.6 m).   Weight as of 07/08/23: 91.3 kg.     Discharge Diagnoses:  Principal Problem:   Seizure [facility]) Active Problems:   Seizures [facility])   Discharge Instructions:  Activity:  As tolerated with Full fall precautions use walker/cane & assistance as needed  Discharge Instructions     (HEART FAILURE PATIENTS) Call MD:  Anytime you have any of the following symptoms: 1) 3 pound weight gain in 24 hours or 5 pounds in 1 week 2) shortness of breath, with or without a dry hacking cough 3) swelling in the hands, feet or stomach 4) if you have to sleep on extra pillows at night in order to breathe.   Complete by: As directed    Ambulatory referral to Neurology   Complete by: As directed  An appointment is requested in approximately: 4 weeks   Diet - low sodium heart healthy   Complete by: As directed    Discharge instructions   Complete by: As directed    Follow with Primary MD  Eather Golder, MD in 1-2 weeks  Please get a complete blood count and chemistry panel checked by your Primary MD at your  next visit, and again as instructed by your Primary MD.  Get Medicines reviewed and adjusted: Please take all your medications with you for your next visit with your Primary MD  Laboratory/radiological data: Please request your Primary MD to go over all hospital tests and procedure/radiological results at the follow up, please ask your Primary MD to get all Hospital records sent to his/her office.  In some cases, they will be blood work, cultures and biopsy results pending at the time of your discharge. Please request that your primary care M.D. follows up on these results.  Also Note the following: If you experience worsening of your admission symptoms, develop shortness of breath, life threatening emergency, suicidal or homicidal thoughts you must seek medical attention immediately by calling 911 or calling your MD immediately  if symptoms less severe.  You must read complete instructions/literature along with all the possible adverse reactions/side effects for all the Medicines you take and that have been prescribed to you. Take any new Medicines after you have completely understood and accpet all the possible adverse reactions/side effects.   Do not drive when taking Pain medications or sleeping medications (Benzodaizepines)  Do not take more than prescribed Pain, Sleep and Anxiety Medications. It is not advisable to combine anxiety,sleep and pain medications without talking with your primary care practitioner  Special Instructions: If you have smoked or chewed Tobacco  in the last 2 yrs please stop smoking, stop any regular Alcohol  and or any Recreational drug use.  Wear Seat belts while driving.  Please note: You were cared for by a hospitalist during your hospital stay. Once you are discharged, your primary care physician will handle any further medical issues. Please note that NO REFILLS for any discharge medications will be authorized once you are discharged, as it is imperative  that you return to your primary care physician (or establish a relationship with a primary care physician if you do not have one) for your post hospital discharge needs so that they can reassess your need for medications and monitor your lab values.     Seizure precautions: Per [place]  DMV statutes, patients with seizures are not allowed to drive until they have been seizure-free for six months and cleared by a physician    Use caution when using heavy equipment or power tools. Avoid working on ladders or at heights. Take showers instead of baths. Ensure the water temperature is not too high on the home water heater. Do not go swimming alone. Do not lock yourself in a room alone (i.e. bathroom). When caring for infants or small children, sit down when holding, feeding, or changing them to minimize risk of injury to the child in the event you have a seizure. Maintain good sleep hygiene. Avoid alcohol.    If patient has another seizure, call 911 and bring them back to the ED if: A.  The seizure lasts longer than 5 minutes.      B.  The patient doesn't wake shortly after the seizure or has new problems such as difficulty seeing, speaking or moving following the seizure C.  The patient  was injured during the seizure D.  The patient has a temperature over 102 F (39C) E.  The patient vomited during the seizure and now is having trouble breathing    During the Seizure   - First, ensure adequate ventilation and place patients on the floor on their left side  Loosen clothing around the neck and ensure the airway is patent. If the patient is clenching the teeth, do not force the mouth open with any object as this can cause severe damage - Remove all items from the surrounding that can be hazardous. The patient may be oblivious to what's happening and may not even know what he or she is doing. If the patient is confused and wandering, either gently guide him/her away and block access to outside  areas - Reassure the individual and be comforting - Call 911. In most cases, the seizure ends before EMS arrives. However, there are cases when seizures may last over 3 to 5 minutes. Or the individual may have developed breathing difficulties or severe injuries. If a pregnant patient or a person with diabetes develops a seizure, it is prudent to call an ambulance. - Finally, if the patient does not regain full consciousness, then call EMS. Most patients will remain confused for about 45 to 90 minutes after a seizure, so you must use judgment in calling for help. - Avoid restraints but make sure the patient is in a bed with padded side rails - Place the individual in a lateral position with the neck slightly flexed; this will help the saliva drain from the mouth and prevent the tongue from falling backward - Remove all nearby furniture and other hazards from the area - Provide verbal assurance as the individual is regaining consciousness - Provide the patient with privacy if possible - Call for help and start treatment as ordered by the caregiver    After the Seizure (Postictal Stage)   After a seizure, most patients experience confusion, fatigue, muscle pain and/or a headache. Thus, one should permit the individual to sleep. For the next few days, reassurance is essential. Being calm and helping reorient the person is also of importance.   Most seizures are painless and end spontaneously. Seizures are not harmful to others but can lead to complications such as stress on the lungs, brain and the heart. Individuals with prior lung problems may develop labored breathing and respiratory distress.    Increase activity slowly   Complete by: As directed       Allergies as of 07/15/2023       Reactions   Filgrastim Other (See Comments)   Other reaction(s): Other (See Comments) Other reaction(s): Other (See Comments) Severe pain Severe pain Severe pain   Pegfilgrastim    Other reaction(s): Other  (See Comments) Severe bone pain Other reaction(s): Other (See Comments) Severe bone pain   Percocet [oxycodone-acetaminophen ] Itching   Codeine Itching   Naproxen Itching        Medication List     PAUSE taking these medications    losartan 25 MG tablet Wait to take this until your doctor or other care provider tells you to start again. Commonly known as: COZAAR Take 1 tablet by mouth daily.   spironolactone 25 MG tablet Wait to take this until your doctor or other care provider tells you to start again. Commonly known as: ALDACTONE Take 1 tablet by mouth daily.       STOP taking these medications    metoprolol tartrate 25 MG  tablet Commonly known as: LOPRESSOR       TAKE these medications    allopurinol 100 MG tablet Commonly known as: ZYLOPRIM Take 100 mg by mouth 2 (two) times daily.   amitriptyline  25 MG tablet Commonly known as: ELAVIL  Take by mouth.   aspirin  EC 81 MG tablet Take by mouth.   empagliflozin  10 MG Tabs tablet Commonly known as: JARDIANCE  Take 1 tablet by mouth daily.   fluticasone 50 MCG/ACT nasal spray Commonly known as: FLONASE Place 2 sprays into both nostrils daily as needed for allergies.   furosemide  40 MG tablet Commonly known as: LASIX  Take 40 mg by mouth daily as needed (for swelling).   gabapentin  300 MG capsule Commonly known as: NEURONTIN  Take 300 mg by mouth 3 (three) times daily.   lenalidomide  15 MG capsule Commonly known as: REVLIMID  Take 15 mg by mouth daily.   levETIRAcetam  500 MG tablet Commonly known as: KEPPRA  Take 1 tablet (500 mg total) by mouth every 12 (twelve) hours.   magnesium 30 MG tablet Take 30 mg by mouth 2 (two) times daily.   metoprolol succinate 25 MG 24 hr tablet Commonly known as: TOPROL-XL Take 0.5 tablets (12.5 mg total) by mouth daily. What changed:  medication strength how much to take   midodrine 5 MG tablet Commonly known as: PROAMATINE Take 1 tablet (5 mg total) by  mouth 3 (three) times daily with meals.   mirtazapine 15 MG tablet Commonly known as: REMERON Take 15 mg by mouth at bedtime.   potassium chloride  20 MEQ packet Commonly known as: KLOR-CON  Take by mouth 2 (two) times daily.   Wegovy 2.4 MG/0.75ML Soaj Generic drug: Semaglutide-Weight Management Inject 2.4 mg into the skin once a week.        Follow-up Information     Eather Golder, MD. Schedule an appointment as soon as possible for a visit in 1 week(s).   Specialty: Family Medicine Contact information: [address] [place] [address] [phone]                Allergies  Allergen Reactions   Filgrastim Other (See Comments)    Other reaction(s): Other (See Comments) Other reaction(s): Other (See Comments) Severe pain Severe pain Severe pain    Pegfilgrastim     Other reaction(s): Other (See Comments) Severe bone pain Other reaction(s): Other (See Comments) Severe bone pain    Percocet [Oxycodone-Acetaminophen ] Itching   Codeine Itching   Naproxen Itching     Other Procedures/Studies: MR BRAIN W WO CONTRAST Result Date: 07/14/2023 CLINICAL DATA:  Initial evaluation for acute seizure. EXAM: MRI HEAD WITHOUT AND WITH CONTRAST MRV HEAD WITHOUT AND WITH CONTRAST TECHNIQUE: Multiplanar, multiecho pulse sequences of the brain and surrounding structures were obtained without and with intravenous contrast. Angiographic images of the intracranial venous structures were obtained using MRV technique without intravenous contrast. CONTRAST:  9 mL of Vueway COMPARISON:  Prior study from 07/10/2023. FINDINGS: MRI HEAD FINDINGS Brain: Cerebral volume within normal limits for age. No focal parenchymal signal abnormality. No abnormal foci of restricted diffusion to suggest acute or subacute ischemia. Gray-white matter differentiation well maintained. No encephalomalacia to suggest chronic cortical infarction or other insult. No foci of susceptibility artifact  indicative of acute or chronic intracranial blood products. No mass lesion, midline shift or mass effect. Ventricles normal in size and morphology without hydrocephalus. No extra-axial fluid collection. Pituitary gland and suprasellar region within normal limits. No intrinsic temporal lobe abnormality. No abnormal enhancement. Vascular:  Major intracranial vascular flow voids are well maintained. Skull and upper cervical spine: Craniocervical junction within normal limits. Visualized upper cervical spine demonstrates no significant finding. Bone marrow signal intensity within normal limits. No scalp soft tissue abnormality. Sinuses/Orbits: Globes and orbital soft tissues are within normal limits. Paranasal sinuses are largely clear. No significant mastoid effusion. 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 Other: None. MRV HEAD FINDINGS Normal flow related signal and enhancement seen throughout the superior sagittal sinus to the torcula. Transverse and sigmoid sinuses are patent as are the jugular bulbs and visualized proximal internal jugular veins. No abnormality seen at the right sigmoid sinus to correspond with previously question abnormality on prior CT. This was presumably artifactual nature. Straight sinus, vein of Galen, and internal cerebral veins are patent. No evidence for dural venous sinus thrombosis. No significant dural venous sinus stenosis. IMPRESSION: 1. Normal brain MRI. No acute intracranial abnormality or findings to explain patient's symptoms. 2. Normal intracranial MRV. No evidence for dural venous sinus thrombosis. No finding to correlate with previously questioned abnormality at the right sigmoid sinus. This was presumably artifactual. Electronically Signed   By: Virgia Griffins M.D.   On: 07/14/2023 18:46   MR MRV HEAD W WO CONTRAST Result Date: 07/14/2023 CLINICAL DATA:  Initial evaluation for acute seizure. EXAM: MRI HEAD WITHOUT AND  WITH CONTRAST MRV HEAD WITHOUT AND WITH CONTRAST TECHNIQUE: Multiplanar, multiecho pulse sequences of the brain and surrounding structures were obtained without and with intravenous contrast. Angiographic images of the intracranial venous structures were obtained using MRV technique without intravenous contrast. CONTRAST:  9 mL of Vueway COMPARISON:  Prior study from 07/10/2023. FINDINGS: MRI HEAD FINDINGS Brain: Cerebral volume within normal limits for age. No focal parenchymal signal abnormality. No abnormal foci of restricted diffusion to suggest acute or subacute ischemia. Gray-white matter differentiation well maintained. No encephalomalacia to suggest chronic cortical infarction or other insult. No foci of susceptibility artifact indicative of acute or chronic intracranial blood products. No mass lesion, midline shift or mass effect. Ventricles normal in size and morphology without hydrocephalus. No extra-axial fluid collection. Pituitary gland and suprasellar region within normal limits. No intrinsic temporal lobe abnormality. No abnormal enhancement. Vascular: Major intracranial vascular flow voids are well maintained. Skull and upper cervical spine: Craniocervical junction within normal limits. Visualized upper cervical spine demonstrates no significant finding. Bone marrow signal intensity within normal limits. No scalp soft tissue abnormality. Sinuses/Orbits: Globes and orbital soft tissues are within normal limits. Paranasal sinuses are largely clear. No significant mastoid effusion. 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 Other: None. MRV HEAD FINDINGS Normal flow related signal and enhancement seen throughout the superior sagittal sinus to the torcula. Transverse and sigmoid sinuses are patent as are the jugular bulbs and visualized proximal internal jugular veins. No abnormality seen at the right sigmoid sinus to correspond with previously question  abnormality on prior CT. This was presumably artifactual nature. Straight sinus, vein of Galen, and internal cerebral veins are patent. No evidence for dural venous sinus thrombosis. No significant dural venous sinus stenosis. IMPRESSION: 1. Normal brain MRI. No acute intracranial abnormality or findings to explain patient's symptoms. 2. Normal intracranial MRV. No evidence for dural venous sinus thrombosis. No finding to correlate with previously questioned abnormality at the right sigmoid sinus. This was presumably artifactual. Electronically Signed   By: Virgia Griffins M.D.   On: 07/14/2023 18:46   DG  Chest Port 1V same Day Result Date: 07/14/2023 CLINICAL DATA:  Seizure EXAM: PORTABLE CHEST 1 VIEW COMPARISON:  05/06/2014 FINDINGS: Interval left-sided pacing device with leads over right atrium and right ventricle. No acute airspace disease or effusion. Interval probable cardiac vascular occlusion device. No acute airspace disease, pleural effusion or pneumothorax. Minimal atelectasis or scarring at left base IMPRESSION: No active disease. Minimal atelectasis or scarring. Interval left-sided cardiac pacing device Electronically Signed   By: Esmeralda Hedge M.D.   On: 07/14/2023 15:12   Overnight EEG with video Result Date: 07/13/2023 Arleene Lack, MD     07/14/2023  9:05 AM Patient Name: Allyson Tineo MRN: [id number] Epilepsy Attending: Arleene Lack Referring Physician/Provider: Kimberley Penman, MD Duration: 07/12/2023 1231 to 07/13/2023 1231  Patient history: 58 y.o. female was brought to the ED today after a witnessed seizure. EEG to evaluate for seizure.  Level of alertness: Awake, asleep  AEDs during EEG study: LEV, GBP  Technical aspects: This EEG study was done with scalp electrodes positioned according to the 10-20 International system of electrode placement. Electrical activity was reviewed with band pass filter of 1-70Hz , sensitivity of 7 uV/mm, display speed of 33mm/sec with a 60Hz   notched filter applied as appropriate. EEG data were recorded continuously and digitally stored.  Video monitoring was available and reviewed as appropriate.  Description: The posterior dominant rhythm consists of 9 Hz activity of moderate voltage (25-35 uV) seen predominantly in posterior head regions, symmetric and reactive to eye opening and eye closing. Sleep was characterized by vertex waves, sleep spindles (12 to 14 Hz), maximal frontocentral region. Hyperventilation and photic stimulation were not performed.    IMPRESSION: This study is within normal limits. No seizures were seen throughout the recording. Priyanka Suzanne Erps   CT ANGIO HEAD NECK W WO CM Result Date: 07/10/2023 CLINICAL DATA:  Neuro deficit, concern for stroke. Witnessed seizure like episode and unexplained confusion. Assess for evidence of CNS vasculitis. EXAM: CT ANGIOGRAPHY HEAD AND NECK WITH AND WITHOUT CONTRAST CT VENOGRAM HEAD TECHNIQUE: Multidetector CT imaging of the head and neck was performed using the standard protocol during bolus administration of intravenous contrast. Multiplanar CT image reconstructions and MIPs were obtained to evaluate the vascular anatomy. Carotid stenosis measurements (when applicable) are obtained utilizing NASCET criteria, using the distal internal carotid diameter as the denominator. Venographic phase images of the brain were obtained following the administration of intravenous contrast. Multiplanar reformats and maximum intensity projections were generated. RADIATION DOSE REDUCTION: This exam was performed according to the departmental dose-optimization program which includes automated exposure control, adjustment of the mA and/or kV according to patient size and/or use of iterative reconstruction technique. CONTRAST:  75mL OMNIPAQUE IOHEXOL 350 MG/ML SOLN COMPARISON:  None Available. FINDINGS: CT HEAD FINDINGS Brain: No acute intracranial hemorrhage. No CT evidence of acute infarct. No edema, mass  effect, or midline shift. The basilar cisterns are patent. Ventricles: Ventricles are normal in size and configuration. Vascular: No hyperdense vessel. Intracranial atherosclerotic calcifications noted. Skull: No acute or aggressive finding. Sinuses/orbits: Orbits are symmetric. Mild mucosal thickening in the left posterior ethmoid air cells. Other: Mastoid air cells are clear. CTA NECK FINDINGS Aortic arch: Common origin of the brachiocephalic and left common carotid arteries. Imaged portion shows no evidence of aneurysm or dissection. No significant stenosis of the major arch vessel origins. Pulmonary arteries: As permitted by contrast timing, there are no filling defects in the visualized pulmonary arteries. Subclavian arteries: The subclavian arteries are patent bilaterally. Right carotid  system: No evidence of dissection, stenosis (50% or greater), or occlusion. Left carotid system: No evidence of dissection, stenosis (50% or greater), or occlusion. Vertebral arteries: Codominant. No evidence of dissection, stenosis (50% or greater), or occlusion. Skeleton: No acute or aggressive finding noted. Other neck: The visualized airway is patent. No cervical lymphadenopathy. Upper chest: Few areas of atelectasis versus scarring in the dependent aspect of the bilateral upper lobes. Partially visualized left chest wall pacer device. Review of the MIP images confirms the above findings CTA HEAD FINDINGS ANTERIOR CIRCULATION: The intracranial ICAs are patent bilaterally. No significant stenosis, proximal occlusion, aneurysm, or vascular malformation. MCAs: The middle cerebral arteries are patent bilaterally. ACAs: The anterior cerebral arteries are patent bilaterally. POSTERIOR CIRCULATION: No significant stenosis, proximal occlusion, aneurysm, or vascular malformation. PCAs: The posterior cerebral arteries are patent bilaterally. Pcomm: Not well visualized. SCAs: The superior cerebellar arteries are patent bilaterally.  Basilar artery: Patent AICAs: Visualized on the right. PICAs: Visualized on the left. Vertebral arteries: The intracranial vertebral arteries are patent. Venous sinuses: The superior sagittal sinus is patent. Visualized cortical veins are unremarkable. The internal cerebral veins, vein of Galen, and straight sinus are patent. Normal appearance of the confluence of the sinuses. The transverse and sigmoid sinuses are patent bilaterally. There is a possible focus of soft tissue along the lateral aspect of the right sigmoid sinus appreciated on series 4 image 131. Possible mild stenosis. The more distal right sigmoid sinus is limited due to artifact. The jugular bulbs are unremarkable. Anatomic variants: None Review of the MIP images confirms the above findings IMPRESSION: No large vessel occlusion. No high-grade stenosis, aneurysm, or dissection of the arteries in the head and neck. No CTA findings to suggest CNS vasculitis. No acute intracranial hemorrhage. Possible small focus of soft tissue along the lateral aspect of the right sigmoid sinus with mild stenosis. However, findings are limited by contrast timing and artifact. Consider MR venogram head with contrast for further characterization. Electronically Signed   By: Denny Flack M.D.   On: 07/10/2023 17:09   CT VENOGRAM HEAD Result Date: 07/10/2023 CLINICAL DATA:  Neuro deficit, concern for stroke. Witnessed seizure like episode and unexplained confusion. Assess for evidence of CNS vasculitis. EXAM: CT ANGIOGRAPHY HEAD AND NECK WITH AND WITHOUT CONTRAST CT VENOGRAM HEAD TECHNIQUE: Multidetector CT imaging of the head and neck was performed using the standard protocol during bolus administration of intravenous contrast. Multiplanar CT image reconstructions and MIPs were obtained to evaluate the vascular anatomy. Carotid stenosis measurements (when applicable) are obtained utilizing NASCET criteria, using the distal internal carotid diameter as the denominator.  Venographic phase images of the brain were obtained following the administration of intravenous contrast. Multiplanar reformats and maximum intensity projections were generated. RADIATION DOSE REDUCTION: This exam was performed according to the departmental dose-optimization program which includes automated exposure control, adjustment of the mA and/or kV according to patient size and/or use of iterative reconstruction technique. CONTRAST:  75mL OMNIPAQUE IOHEXOL 350 MG/ML SOLN COMPARISON:  None Available. FINDINGS: CT HEAD FINDINGS Brain: No acute intracranial hemorrhage. No CT evidence of acute infarct. No edema, mass effect, or midline shift. The basilar cisterns are patent. Ventricles: Ventricles are normal in size and configuration. Vascular: No hyperdense vessel. Intracranial atherosclerotic calcifications noted. Skull: No acute or aggressive finding. Sinuses/orbits: Orbits are symmetric. Mild mucosal thickening in the left posterior ethmoid air cells. Other: Mastoid air cells are clear. CTA NECK FINDINGS Aortic arch: Common origin of the brachiocephalic and left common carotid arteries. Imaged  portion shows no evidence of aneurysm or dissection. No significant stenosis of the major arch vessel origins. Pulmonary arteries: As permitted by contrast timing, there are no filling defects in the visualized pulmonary arteries. Subclavian arteries: The subclavian arteries are patent bilaterally. Right carotid system: No evidence of dissection, stenosis (50% or greater), or occlusion. Left carotid system: No evidence of dissection, stenosis (50% or greater), or occlusion. Vertebral arteries: Codominant. No evidence of dissection, stenosis (50% or greater), or occlusion. Skeleton: No acute or aggressive finding noted. Other neck: The visualized airway is patent. No cervical lymphadenopathy. Upper chest: Few areas of atelectasis versus scarring in the dependent aspect of the bilateral upper lobes. Partially visualized  left chest wall pacer device. Review of the MIP images confirms the above findings CTA HEAD FINDINGS ANTERIOR CIRCULATION: The intracranial ICAs are patent bilaterally. No significant stenosis, proximal occlusion, aneurysm, or vascular malformation. MCAs: The middle cerebral arteries are patent bilaterally. ACAs: The anterior cerebral arteries are patent bilaterally. POSTERIOR CIRCULATION: No significant stenosis, proximal occlusion, aneurysm, or vascular malformation. PCAs: The posterior cerebral arteries are patent bilaterally. Pcomm: Not well visualized. SCAs: The superior cerebellar arteries are patent bilaterally. Basilar artery: Patent AICAs: Visualized on the right. PICAs: Visualized on the left. Vertebral arteries: The intracranial vertebral arteries are patent. Venous sinuses: The superior sagittal sinus is patent. Visualized cortical veins are unremarkable. The internal cerebral veins, vein of Galen, and straight sinus are patent. Normal appearance of the confluence of the sinuses. The transverse and sigmoid sinuses are patent bilaterally. There is a possible focus of soft tissue along the lateral aspect of the right sigmoid sinus appreciated on series 4 image 131. Possible mild stenosis. The more distal right sigmoid sinus is limited due to artifact. The jugular bulbs are unremarkable. Anatomic variants: None Review of the MIP images confirms the above findings IMPRESSION: No large vessel occlusion. No high-grade stenosis, aneurysm, or dissection of the arteries in the head and neck. No CTA findings to suggest CNS vasculitis. No acute intracranial hemorrhage. Possible small focus of soft tissue along the lateral aspect of the right sigmoid sinus with mild stenosis. However, findings are limited by contrast timing and artifact. Consider MR venogram head with contrast for further characterization. Electronically Signed   By: Denny Flack M.D.   On: 07/10/2023 17:09   CT CHEST ABDOMEN PELVIS W  CONTRAST Result Date: 07/10/2023 CLINICAL DATA:  Neurologic deficit, concern for occult malignancy, history of multiple myeloma EXAM: CT CHEST, ABDOMEN, AND PELVIS WITH CONTRAST TECHNIQUE: Multidetector CT imaging of the chest, abdomen and pelvis was performed following the standard protocol during bolus administration of intravenous contrast. RADIATION DOSE REDUCTION: This exam was performed according to the departmental dose-optimization program which includes automated exposure control, adjustment of the mA and/or kV according to patient size and/or use of iterative reconstruction technique. CONTRAST:  75mL OMNIPAQUE IOHEXOL 350 MG/ML SOLN COMPARISON:  04/03/2013 FINDINGS: CT CHEST FINDINGS Cardiovascular: Dual lead cardiac pacer. Heart is unremarkable without pericardial effusion. No evidence of thoracic aortic aneurysm or dissection. Occlusion device within the left atrial appendage. Mediastinum/Nodes: No enlarged mediastinal, hilar, or axillary lymph nodes. Thyroid gland, trachea, and esophagus demonstrate no significant findings. Lungs/Pleura: Dependent hypoventilatory changes within the lower lobes. No acute airspace disease, effusion, or pneumothorax. The central airways are patent. Musculoskeletal: No acute or destructive bony abnormalities. Reconstructed images demonstrate no additional findings. CT ABDOMEN PELVIS FINDINGS Hepatobiliary: Multiple simple appearing hepatic cysts do not require specific imaging follow-up. Gallbladder is unremarkable. No biliary duct dilation. Pancreas:  Unremarkable. No pancreatic ductal dilatation or surrounding inflammatory changes. Spleen: Normal in size without focal abnormality. Adrenals/Urinary Tract: No urinary tract calculi or obstructive uropathy within either kidney. Kidneys enhance normally. The adrenals and bladder are unremarkable. Stomach/Bowel: No bowel obstruction or ileus. Normal appendix right lower quadrant. There is mild mural thickening and submucosal  edema from the cecum through the splenic flexure of the colon, without significant inflammatory change. Findings could reflect inflammatory or infectious colitis. Postsurgical changes of the stomach, with small hiatal hernia noted. Vascular/Lymphatic: No significant vascular findings are present. No enlarged abdominal or pelvic lymph nodes. Reproductive: Status post hysterectomy. No adnexal masses. Other: No free fluid or free intraperitoneal gas. Subcutaneous gas in the midline supraumbilical region could reflect sequela of subcutaneous injection. No abdominal wall hernia. Musculoskeletal: No acute or destructive bony abnormalities. Reconstructed images demonstrate no additional findings. IMPRESSION: 1. No evidence of malignancy within the chest, abdomen, or pelvis. 2. Mild mural thickening and submucosal edema from the cecum through the distal transverse colon, which could reflect inflammatory or infectious colitis. 3. Small hiatal hernia. Electronically Signed   By: Bobbye Burrow M.D.   On: 07/10/2023 15:52   EEG adult Result Date: 07/09/2023 Arleene Lack, MD     07/09/2023  6:31 PM Patient Name: Itzayana Pardy MRN: [id number] Epilepsy Attending: Arleene Lack Referring Physician/Provider: Hoyt Macleod, MD Date: 07/09/2023 Duration: 31.58mins Patient history: 58 y.o. female was brought to the ED today after a witnessed seizure. EEG to evaluate for seizure. Level of alertness: Awake, asleep AEDs during EEG study: LEV, Ativan ,GBP Technical aspects: This EEG study was done with scalp electrodes positioned according to the 10-20 International system of electrode placement. Electrical activity was reviewed with band pass filter of 1-70Hz , sensitivity of 7 uV/mm, display speed of 53mm/sec with a 60Hz  notched filter applied as appropriate. EEG data were recorded continuously and digitally stored.  Video monitoring was available and reviewed as appropriate. Description: The posterior dominant rhythm  consists of 9 Hz activity of moderate voltage (25-35 uV) seen predominantly in posterior head regions, symmetric and reactive to eye opening and eye closing. Sleep was characterized by vertex waves, sleep spindles (12 to 14 Hz), maximal frontocentral region. Sharp transients were noted in right frontal region. Hyperventilation and photic stimulation were not performed.   IMPRESSION: This study is within normal limits. No seizures were seen throughout the recording. Sharp transients were noted in right frontal region. Consider long term eeg if concern for ictal-interictal activity persists. Arleene Lack   CT Head Wo Contrast Result Date: 07/08/2023 CLINICAL DATA:  Altered mental status EXAM: CT HEAD WITHOUT CONTRAST TECHNIQUE: Contiguous axial images were obtained from the base of the skull through the vertex without intravenous contrast. RADIATION DOSE REDUCTION: This exam was performed according to the departmental dose-optimization program which includes automated exposure control, adjustment of the mA and/or kV according to patient size and/or use of iterative reconstruction technique. COMPARISON:  06/08/2011 FINDINGS: Brain: No evidence of acute infarction, hemorrhage, hydrocephalus, extra-axial collection or mass lesion/mass effect. Vascular: No hyperdense vessel or unexpected calcification. Skull: Normal. Negative for fracture or focal lesion. Sinuses/Orbits: No acute finding. Other: None. IMPRESSION: No acute intracranial pathology. Electronically Signed   By: Fredricka Jenny M.D.   On: 07/08/2023 14:37     TODAY-DAY OF DISCHARGE:  Subjective:   Carly Randall today has no headache,no chest abdominal pain,no new weakness tingling or numbness, feels much better wants to go home today.   Objective:   Blood  pressure 105/64, pulse 79, temperature 98.5 F (36.9 C), temperature source Oral, resp. rate 15, SpO2 97%. No intake or output data in the 24 hours ending 07/15/23 0946 There were no  vitals filed for this visit.  Exam: Awake Alert, Oriented *3, No new F.N deficits, Normal affect [place].AT,PERRAL Supple Neck,No JVD, No cervical lymphadenopathy appriciated.  Symmetrical Chest wall movement, Good air movement bilaterally, CTAB RRR,No Gallops,Rubs or new Murmurs, No Parasternal Heave +ve B.Sounds, Abd Soft, Non tender, No organomegaly appriciated, No rebound -guarding or rigidity. No Cyanosis, Clubbing or edema, No new Rash or bruise   PERTINENT RADIOLOGIC STUDIES: MR BRAIN W WO CONTRAST Result Date: 07/14/2023 CLINICAL DATA:  Initial evaluation for acute seizure. EXAM: MRI HEAD WITHOUT AND WITH CONTRAST MRV HEAD WITHOUT AND WITH CONTRAST TECHNIQUE: Multiplanar, multiecho pulse sequences of the brain and surrounding structures were obtained without and with intravenous contrast. Angiographic images of the intracranial venous structures were obtained using MRV technique without intravenous contrast. CONTRAST:  9 mL of Vueway COMPARISON:  Prior study from 07/10/2023. FINDINGS: MRI HEAD FINDINGS Brain: Cerebral volume within normal limits for age. No focal parenchymal signal abnormality. No abnormal foci of restricted diffusion to suggest acute or subacute ischemia. Gray-white matter differentiation well maintained. No encephalomalacia to suggest chronic cortical infarction or other insult. No foci of susceptibility artifact indicative of acute or chronic intracranial blood products. No mass lesion, midline shift or mass effect. Ventricles normal in size and morphology without hydrocephalus. No extra-axial fluid collection. Pituitary gland and suprasellar region within normal limits. No intrinsic temporal lobe abnormality. No abnormal enhancement. Vascular: Major intracranial vascular flow voids are well maintained. Skull and upper cervical spine: Craniocervical junction within normal limits. Visualized upper cervical spine demonstrates no significant finding. Bone marrow signal intensity  within normal limits. No scalp soft tissue abnormality. Sinuses/Orbits: Globes and orbital soft tissues are within normal limits. Paranasal sinuses are largely clear. No significant mastoid effusion. 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 Other: None. MRV HEAD FINDINGS Normal flow related signal and enhancement seen throughout the superior sagittal sinus to the torcula. Transverse and sigmoid sinuses are patent as are the jugular bulbs and visualized proximal internal jugular veins. No abnormality seen at the right sigmoid sinus to correspond with previously question abnormality on prior CT. This was presumably artifactual nature. Straight sinus, vein of Galen, and internal cerebral veins are patent. No evidence for dural venous sinus thrombosis. No significant dural venous sinus stenosis. IMPRESSION: 1. Normal brain MRI. No acute intracranial abnormality or findings to explain patient's symptoms. 2. Normal intracranial MRV. No evidence for dural venous sinus thrombosis. No finding to correlate with previously questioned abnormality at the right sigmoid sinus. This was presumably artifactual. Electronically Signed   By: Virgia Griffins M.D.   On: 07/14/2023 18:46   MR MRV HEAD W WO CONTRAST Result Date: 07/14/2023 CLINICAL DATA:  Initial evaluation for acute seizure. EXAM: MRI HEAD WITHOUT AND WITH CONTRAST MRV HEAD WITHOUT AND WITH CONTRAST TECHNIQUE: Multiplanar, multiecho pulse sequences of the brain and surrounding structures were obtained without and with intravenous contrast. Angiographic images of the intracranial venous structures were obtained using MRV technique without intravenous contrast. CONTRAST:  9 mL of Vueway COMPARISON:  Prior study from 07/10/2023. FINDINGS: MRI HEAD FINDINGS Brain: Cerebral volume within normal limits for age. No focal parenchymal signal abnormality. No abnormal foci of restricted diffusion to suggest acute or subacute  ischemia. Gray-white matter differentiation well maintained. No encephalomalacia  to suggest chronic cortical infarction or other insult. No foci of susceptibility artifact indicative of acute or chronic intracranial blood products. No mass lesion, midline shift or mass effect. Ventricles normal in size and morphology without hydrocephalus. No extra-axial fluid collection. Pituitary gland and suprasellar region within normal limits. No intrinsic temporal lobe abnormality. No abnormal enhancement. Vascular: Major intracranial vascular flow voids are well maintained. Skull and upper cervical spine: Craniocervical junction within normal limits. Visualized upper cervical spine demonstrates no significant finding. Bone marrow signal intensity within normal limits. No scalp soft tissue abnormality. Sinuses/Orbits: Globes and orbital soft tissues are within normal limits. Paranasal sinuses are largely clear. No significant mastoid effusion. 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 Other: None. MRV HEAD FINDINGS Normal flow related signal and enhancement seen throughout the superior sagittal sinus to the torcula. Transverse and sigmoid sinuses are patent as are the jugular bulbs and visualized proximal internal jugular veins. No abnormality seen at the right sigmoid sinus to correspond with previously question abnormality on prior CT. This was presumably artifactual nature. Straight sinus, vein of Galen, and internal cerebral veins are patent. No evidence for dural venous sinus thrombosis. No significant dural venous sinus stenosis. IMPRESSION: 1. Normal brain MRI. No acute intracranial abnormality or findings to explain patient's symptoms. 2. Normal intracranial MRV. No evidence for dural venous sinus thrombosis. No finding to correlate with previously questioned abnormality at the right sigmoid sinus. This was presumably artifactual. Electronically Signed   By: Virgia Griffins M.D.   On: 07/14/2023 18:46   DG Chest Port 1V same Day Result Date: 07/14/2023 CLINICAL DATA:  Seizure EXAM: PORTABLE CHEST 1 VIEW COMPARISON:  05/06/2014 FINDINGS: Interval left-sided pacing device with leads over right atrium and right ventricle. No acute airspace disease or effusion. Interval probable cardiac vascular occlusion device. No acute airspace disease, pleural effusion or pneumothorax. Minimal atelectasis or scarring at left base IMPRESSION: No active disease. Minimal atelectasis or scarring. Interval left-sided cardiac pacing device Electronically Signed   By: Esmeralda Hedge M.D.   On: 07/14/2023 15:12     PERTINENT LAB RESULTS: CBC: Recent Labs    07/12/23 1457 07/13/23 0533  WBC 6.2 5.5  HGB 10.4* 9.4*  HCT 33.3* 29.4*  PLT 185 163   CMET CMP     Component Value Date/Time   NA 138 07/15/2023 0746   NA 136 04/03/2013 1242   K 3.8 07/15/2023 0746   K 3.8 04/03/2013 1242   CL 104 07/15/2023 0746   CL 106 04/03/2013 1242   CO2 25 07/15/2023 0746   CO2 26 04/03/2013 1242   GLUCOSE 89 07/15/2023 0746   GLUCOSE 86 04/03/2013 1242   BUN 17 07/15/2023 0746   BUN 10 04/03/2013 1242   CREATININE 1.08 (H) 07/15/2023 0746   CREATININE 0.82 04/03/2013 1242   CALCIUM 8.6 (L) 07/15/2023 0746   CALCIUM 8.8 04/03/2013 1242   PROT 6.6 07/08/2023 1208   PROT 7.5 04/03/2013 1242   ALBUMIN 3.2 (L) 07/08/2023 1208   ALBUMIN 3.4 04/03/2013 1242   AST 29 07/08/2023 1208   AST 33 04/03/2013 1242   ALT 10 07/08/2023 1208   ALT 17 04/03/2013 1242   ALKPHOS 53 07/08/2023 1208   ALKPHOS 79 04/03/2013 1242   BILITOT 0.9 07/08/2023 1208   BILITOT 0.6 04/03/2013 1242   GFRNONAA 60 (L) 07/15/2023 0746   GFRNONAA >60 04/03/2013 1242    GFR Estimated Creatinine Clearance: 61 mL/min (A) (by  C-G formula based on SCr of 1.08 mg/dL (H)). No results for input(s): LIPASE, AMYLASE in the last 72 hours. No results for input(s): CKTOTAL, CKMB, CKMBINDEX, TROPONINI  in the last 72 hours. Invalid input(s): POCBNP No results for input(s): DDIMER in the last 72 hours. No results for input(s): HGBA1C in the last 72 hours. No results for input(s): CHOL, HDL, LDLCALC, TRIG, CHOLHDL, LDLDIRECT in the last 72 hours. No results for input(s): TSH, T4TOTAL, T3FREE, THYROIDAB in the last 72 hours.  Invalid input(s): FREET3 No results for input(s): VITAMINB12, FOLATE, FERRITIN, TIBC, IRON, RETICCTPCT in the last 72 hours. Coags: No results for input(s): INR in the last 72 hours.  Invalid input(s): PT Microbiology: No results found for this or any previous visit (from the past 240 hours).  FURTHER DISCHARGE INSTRUCTIONS:  Get Medicines reviewed and adjusted: Please take all your medications with you for your next visit with your Primary MD  Laboratory/radiological data: Please request your Primary MD to go over all hospital tests and procedure/radiological results at the follow up, please ask your Primary MD to get all Hospital records sent to his/her office.  In some cases, they will be blood work, cultures and biopsy results pending at the time of your discharge. Please request that your primary care M.D. goes through all the records of your hospital data and follows up on these results.  Also Note the following: If you experience worsening of your admission symptoms, develop shortness of breath, life threatening emergency, suicidal or homicidal thoughts you must seek medical attention immediately by calling 911 or calling your MD immediately  if symptoms less severe.  You must read complete instructions/literature along with all the possible adverse reactions/side effects for all the Medicines you take and that have been prescribed to you. Take any new Medicines after you have completely understood and accpet all the possible adverse reactions/side effects.   Do not drive when taking Pain medications or sleeping  medications (Benzodaizepines)  Do not take more than prescribed Pain, Sleep and Anxiety Medications. It is not advisable to combine anxiety,sleep and pain medications without talking with your primary care practitioner  Special Instructions: If you have smoked or chewed Tobacco  in the last 2 yrs please stop smoking, stop any regular Alcohol  and or any Recreational drug use.  Wear Seat belts while driving.  Please note: You were cared for by a hospitalist during your hospital stay. Once you are discharged, your primary care physician will handle any further medical issues. Please note that NO REFILLS for any discharge medications will be authorized once you are discharged, as it is imperative that you return to your primary care physician (or establish a relationship with a primary care physician if you do not have one) for your post hospital discharge needs so that they can reassess your need for medications and monitor your lab values.  Total Time spent coordinating discharge including counseling, education and face to face time equals greater than 30 minutes.  SignedKimberly Penna 07/15/2023 9:46 AM

## 2023-07-15 NOTE — Plan of Care (Signed)
  Problem: Health Behavior/Discharge Planning: Goal: Ability to manage health-related needs will improve Outcome: Progressing   Problem: Clinical Measurements: Goal: Ability to maintain clinical measurements within normal limits will improve Outcome: Progressing Goal: Respiratory complications will improve Outcome: Progressing Goal: Cardiovascular complication will be avoided Outcome: Progressing   Problem: Elimination: Goal: Will not experience complications related to urinary retention Outcome: Progressing   Problem: Medication: Goal: Risk for medication side effects will decrease Outcome: Progressing   Problem: Safety: Goal: Verbalization of understanding the information provided will improve Outcome: Progressing

## 2023-09-09 ENCOUNTER — Ambulatory Visit (INDEPENDENT_AMBULATORY_CARE_PROVIDER_SITE_OTHER)

## 2023-09-09 ENCOUNTER — Ambulatory Visit
Admission: RE | Admit: 2023-09-09 | Discharge: 2023-09-09 | Disposition: A | Discharge status: Home / Self Care | Source: Ambulatory Visit | Attending: Physician Assistant | Admitting: Physician Assistant

## 2023-09-09 VITALS — BP 114/78 | HR 83 | Temp 98.3°F | Resp 18

## 2023-09-09 DIAGNOSIS — R0602 Shortness of breath: Secondary | ICD-10-CM

## 2023-09-09 DIAGNOSIS — B349 Viral infection, unspecified: Secondary | ICD-10-CM | POA: Diagnosis not present

## 2023-09-09 DIAGNOSIS — R5383 Other fatigue: Secondary | ICD-10-CM

## 2023-09-09 DIAGNOSIS — R051 Acute cough: Secondary | ICD-10-CM

## 2023-09-09 LAB — SARS CORONAVIRUS 2 BY RT PCR: SARS Coronavirus 2 by RT PCR: NEGATIVE

## 2023-09-09 MED ORDER — PROMETHAZINE-DM 6.25-15 MG/5ML PO SYRP: 5.0000 mL | ORAL_SOLUTION | Freq: Four times a day (QID) | ORAL | 0 refills | Status: AC | PRN

## 2023-09-09 NOTE — ED Provider Notes (Signed)
 [facility]    CSN: [id number] Arrival date & time: 09/09/23  1442      History   Chief Complaint Chief Complaint  Patient presents with   Cough    Dizziness shortness of breath - Entered by patient    HPI Klaryssa Fauth is a 58 y.o. female presenting for 2-day history of cough, congestion, fatigue, mild shortness of breath and chest pressure.  Denies fever, sore throat, nasal congestion.  No sick contacts.  No leg swelling, palpitations, or weakness. No history of pulmonary disease.  Medical history is significant for CHF, multiple myeloma in remission, atrial fibrillation, obesity, and seizures.  HPI  Past Medical History:  Diagnosis Date   Cancer (HCC) 11/2011   multiple myeloma   Congestive heart failure (CHF) [facility])     Patient Active Problem List   Diagnosis Date Noted   Atrial flutter (HCC) 07/10/2023   Chronic combined systolic and diastolic CHF (congestive heart failure) (HCC) 07/10/2023   Paroxysmal atrial fibrillation (HCC) 07/10/2023   Multiple myeloma in remission (HCC) 07/10/2023   Obesity (BMI 30-39.9) 07/10/2023   Seizures (HCC) 07/09/2023   Seizure (HCC) 07/08/2023    Past Surgical History:  Procedure Laterality Date   ABDOMINAL HYSTERECTOMY     BREAST BIOPSY Right 2011   neg   HAND SURGERY      OB History   No obstetric history on file.      Home Medications    Prior to Admission medications   Medication Sig Start Date End Date Taking? Authorizing Provider  allopurinol (ZYLOPRIM) 100 MG tablet Take 100 mg by mouth 2 (two) times daily. 12/17/22 12/17/23 Yes [provider]  amitriptyline  (ELAVIL ) 25 MG tablet Take by mouth. 07/19/17  Yes [provider]  aspirin  EC 81 MG tablet Take by mouth.   Yes [provider]  empagliflozin  (JARDIANCE ) 10 MG TABS tablet Take 1 tablet by mouth daily. 06/24/23  Yes [provider]  fluticasone (FLONASE) 50 MCG/ACT nasal spray Place 2 sprays into both  nostrils daily as needed for allergies. 02/26/23  Yes [provider]  furosemide  (LASIX ) 40 MG tablet Take 40 mg by mouth daily as needed (for swelling). 12/10/22 12/10/23 Yes [provider]  gabapentin  (NEURONTIN ) 300 MG capsule Take 300 mg by mouth 3 (three) times daily.   Yes [provider]  lenalidomide  (REVLIMID ) 15 MG capsule Take 15 mg by mouth daily.   Yes [provider]  magnesium 30 MG tablet Take 30 mg by mouth 2 (two) times daily.   Yes [provider]  metoprolol  succinate (TOPROL -XL) 25 MG 24 hr tablet Take 0.5 tablets (12.5 mg total) by mouth daily. 07/15/23 07/14/24 Yes Ghimire, Donalda HERO, MD  midodrine  (PROAMATINE ) 5 MG tablet Take 1 tablet (5 mg total) by mouth 3 (three) times daily with meals. 07/15/23  Yes Ghimire, Donalda HERO, MD  mirtazapine (REMERON) 15 MG tablet Take 15 mg by mouth at bedtime. 10/26/22 10/26/23 Yes [provider]  potassium chloride  (KLOR-CON ) 20 MEQ packet Take by mouth 2 (two) times daily.   Yes [provider]  promethazine -dextromethorphan (PROMETHAZINE -DM) 6.25-15 MG/5ML syrup Take 5 mLs by mouth 4 (four) times daily as needed. 09/09/23  Yes Arvis Huxley B, PA-C  WEGOVY 2.4 MG/0.75ML SOAJ Inject 2.4 mg into the skin once a week. 05/22/23  Yes [provider]  levETIRAcetam  (KEPPRA ) 500 MG tablet Take 1 tablet (500 mg total) by mouth every 12 (twelve) hours. 07/15/23 08/14/23  Ghimire,  Donalda HERO, MD  losartan  (COZAAR ) 25 MG tablet Take 1 tablet by mouth daily. 12/17/22 12/17/23  [provider]  spironolactone (ALDACTONE) 25 MG tablet Take 1 tablet by mouth daily. 12/10/22 12/10/23  [provider]    Family History Family History  Problem Relation Age of Onset   Hyperthyroidism Mother    Heart disease Father     Social History Social History   Tobacco Use   Smoking status: Never   Smokeless tobacco: Never  Vaping Use   Vaping status: Never Used  Substance Use  Topics   Alcohol use: No   Drug use: Never     Allergies   Filgrastim, Pegfilgrastim, Percocet [oxycodone-acetaminophen ], Codeine, and Naproxen   Review of Systems Review of Systems  Constitutional:  Positive for fatigue. Negative for chills, diaphoresis and fever.  HENT:  Positive for congestion. Negative for ear pain, rhinorrhea, sinus pain and sore throat.   Respiratory:  Positive for cough and shortness of breath.   Cardiovascular:  Positive for chest pain.  Gastrointestinal:  Negative for abdominal pain, nausea and vomiting.  Musculoskeletal:  Negative for arthralgias and myalgias.  Skin:  Negative for rash.  Neurological:  Positive for dizziness. Negative for weakness and headaches.  Hematological:  Negative for adenopathy.     Physical Exam Triage Vital Signs ED Triage Vitals [09/09/23 1456]  Encounter Vitals Group     BP 114/78     Girls Systolic BP Percentile      Girls Diastolic BP Percentile      Boys Systolic BP Percentile      Boys Diastolic BP Percentile      Pulse Rate 83     Resp 18     Temp 98.3 F (36.8 C)     Temp Source Oral     SpO2 95 %     Weight      Height      Head Circumference      Peak Flow      Pain Score      Pain Loc      Pain Education      Exclude from Growth Chart    No data found.  Updated Vital Signs BP 114/78 (BP Location: Left Arm)   Pulse 83   Temp 98.3 F (36.8 C) (Oral)   Resp 18   SpO2 95%   Physical Exam Vitals and nursing note reviewed.  Constitutional:      General: She is not in acute distress.    Appearance: Normal appearance. She is not ill-appearing or toxic-appearing.     Comments: Patient coughs frequently  HENT:     Head: Normocephalic and atraumatic.     Nose: Nose normal.     Mouth/Throat:     Mouth: Mucous membranes are moist.     Pharynx: Oropharynx is clear.  Eyes:     General: No scleral icterus.       Right eye: No discharge.        Left eye: No discharge.     Conjunctiva/sclera:  Conjunctivae normal.  Cardiovascular:     Rate and Rhythm: Normal rate and regular rhythm.     Heart sounds: Normal heart sounds.  Pulmonary:     Effort: Pulmonary effort is normal. No respiratory distress.     Breath sounds: Normal breath sounds.  Musculoskeletal:     Cervical back: Neck supple.     Right lower leg: No edema.     Left lower leg: No edema.  Skin:    General: Skin is dry.  Neurological:     General: No focal deficit present.     Mental Status: She is alert. Mental status is at baseline.     Motor: No weakness.     Gait: Gait normal.  Psychiatric:        Mood and Affect: Mood normal.        Behavior: Behavior normal.      UC Treatments / Results  Labs (all labs ordered are listed, but only abnormal results are displayed) Labs Reviewed  SARS CORONAVIRUS 2 BY RT PCR    EKG   Radiology DG Chest 2 View Result Date: 09/09/2023 CLINICAL DATA:  Cough with shortness of breath for 2 days. EXAM: CHEST - 2 VIEW COMPARISON:  Chest CT 07/10/2023. Radiographs 05/06/2014 and 07/14/2023. FINDINGS: Left subclavian pacemaker leads project over the right atrium and right ventricle. Stable mild cardiac enlargement post left atrial appendage occluder placement. There is stable chronic atelectasis or scarring in the left lower lobe. The lungs are otherwise clear. No pleural effusion or pneumothorax. The bones appear unremarkable. IMPRESSION: No evidence of acute cardiopulmonary process. Stable chronic atelectasis or scarring in the left lower lobe. Electronically Signed   By: Elsie Perone M.D.   On: 09/09/2023 15:46    Procedures Procedures (including critical care time)  Medications Ordered in UC Medications - No data to display  Initial Impression / Assessment and Plan / UC Course  I have reviewed the triage vital signs and the nursing notes.  Pertinent labs & imaging results that were available during my care of the patient were reviewed by me and considered in my  medical decision making (see chart for details).   58 year old female presents for 2-day history of fatigue, cough, chest pressure and shortness of breath.  No fever.  No leg swelling, palpitations or weakness.  Vitals are stable and normal and patient is overall well-appearing.  No acute distress.  On exam, no nasal congestion and throat is clear.  She does cough frequently.  Chest is clear.  Heart regular rate and rhythm.  No leg swelling.  COVID test performed and negative.  Chest x-ray performed given complaint of chest pressure and shortness of breath.  Wet read negative.  Over read negative.  Reviewed results with patient.  Suspect viral illness.  Supportive care encouraged with increasing rest and fluids.  Sent Promethazine  DM to pharmacy.  Reviewed returning if fever or worsening symptoms.  Advise going to ER or calling 911 if increased chest pain or worsening shortness of breath.  Patient is understanding and agreeable with plan.   Final Clinical Impressions(s) / UC Diagnoses   Final diagnoses:  Acute cough  Viral illness  Shortness of breath  Other fatigue     Discharge Instructions      -Negative COVID -No pneumonia seen on chest xray.  URI/COLD SYMPTOMS: Your exam today is consistent with a viral illness. Antibiotics are not indicated at this time. Use medications as directed, including cough syrup, nasal saline, and decongestants. Your symptoms should improve over the next few days and resolve within 7-10 days. Increase rest and fluids. F/u if symptoms worsen or predominate such as sore throat, ear pain, productive cough, shortness of breath, or if you develop high fevers or worsening fatigue over the next several days.       ED Prescriptions     Medication Sig Dispense Auth. Provider   promethazine -dextromethorphan (PROMETHAZINE -DM) 6.25-15 MG/5ML syrup Take 5 mLs by  mouth 4 (four) times daily as needed. 118 mL Arvis Jolan NOVAK, PA-C      PDMP not reviewed this  encounter.   Arvis Jolan NOVAK, PA-C 09/09/23 [phone]

## 2023-09-09 NOTE — Discharge Instructions (Signed)
-  Negative COVID -No pneumonia seen on chest xray.  URI/COLD SYMPTOMS: Your exam today is consistent with a viral illness. Antibiotics are not indicated at this time. Use medications as directed, including cough syrup, nasal saline, and decongestants. Your symptoms should improve over the next few days and resolve within 7-10 days. Increase rest and fluids. F/u if symptoms worsen or predominate such as sore throat, ear pain, productive cough, shortness of breath, or if you develop high fevers or worsening fatigue over the next several days.

## 2023-09-09 NOTE — ED Triage Notes (Signed)
 Cough and nasal congestion x 2 days. Patient states she is not coughing up mucous. Patient denies any fever or diarrhea.

## 2023-12-28 ENCOUNTER — Encounter: Payer: Self-pay | Admitting: Oncology

## 2024-01-10 ENCOUNTER — Inpatient Hospital Stay: Admitting: Oncology

## 2024-01-10 ENCOUNTER — Inpatient Hospital Stay

## 2024-01-14 ENCOUNTER — Encounter: Payer: Self-pay | Admitting: Oncology

## 2024-01-14 ENCOUNTER — Other Ambulatory Visit: Payer: Self-pay

## 2024-01-14 ENCOUNTER — Inpatient Hospital Stay: Attending: Oncology | Admitting: Oncology

## 2024-01-14 ENCOUNTER — Inpatient Hospital Stay

## 2024-01-14 VITALS — BP 118/75 | HR 71 | Temp 97.9°F | Resp 20 | Wt 176.5 lb

## 2024-01-14 DIAGNOSIS — Z79899 Other long term (current) drug therapy: Secondary | ICD-10-CM | POA: Insufficient documentation

## 2024-01-14 DIAGNOSIS — R238 Other skin changes: Secondary | ICD-10-CM | POA: Diagnosis not present

## 2024-01-14 DIAGNOSIS — D649 Anemia, unspecified: Secondary | ICD-10-CM | POA: Diagnosis not present

## 2024-01-14 DIAGNOSIS — I509 Heart failure, unspecified: Secondary | ICD-10-CM | POA: Insufficient documentation

## 2024-01-14 DIAGNOSIS — C9001 Multiple myeloma in remission: Secondary | ICD-10-CM | POA: Diagnosis not present

## 2024-01-14 LAB — CBC (CANCER CENTER ONLY)
HCT: 29 % — ABNORMAL LOW (ref 36.0–46.0)
Hemoglobin: 9.1 g/dL — ABNORMAL LOW (ref 12.0–15.0)
MCH: 30.6 pg (ref 26.0–34.0)
MCHC: 31.4 g/dL (ref 30.0–36.0)
MCV: 97.6 fL (ref 80.0–100.0)
Platelet Count: 191 K/uL (ref 150–400)
RBC: 2.97 MIL/uL — ABNORMAL LOW (ref 3.87–5.11)
RDW: 16.2 % — ABNORMAL HIGH (ref 11.5–15.5)
WBC Count: 2 K/uL — ABNORMAL LOW (ref 4.0–10.5)
nRBC: 0 % (ref 0.0–0.2)

## 2024-01-14 LAB — PLATELET FUNCTION ASSAY
Collagen / ADP: 126 s — ABNORMAL HIGH (ref 0–118)
Collagen / Epinephrine: >300 s — ABNORMAL HIGH (ref 0–193)

## 2024-01-14 LAB — FERRITIN: Ferritin: 179 ng/mL (ref 11–307)

## 2024-01-14 LAB — IRON AND TIBC
Iron: 69 ug/dL (ref 28–170)
Saturation Ratios: 24 % (ref 10.4–31.8)
TIBC: 291 ug/dL (ref 250–450)
UIBC: 222 ug/dL

## 2024-01-14 LAB — VITAMIN B12: Vitamin B-12: 382 pg/mL (ref 180–914)

## 2024-01-14 LAB — APTT: aPTT: 31 s (ref 24–36)

## 2024-01-14 LAB — FOLATE: Folate: >20 ng/mL (ref >5.9)

## 2024-01-14 NOTE — Progress Notes (Signed)
 [facility]  [phone] [phone]  ID: Carly Randall OB: 02-Sep-1965  MR#: [id number]  RDW#:[id number]  Patient Care Team: Tobie Border, MD as PCP - General (Family Medicine) Jacobo Carly PARAS, MD as Consulting Physician (Oncology)  CHIEF COMPLAINT: Multiple myeloma in remission, anemia, easy bruising.  INTERVAL HISTORY: Patient is a 58 year old female who is status post autologous bone marrow transplant in approximately 2021 who has been on maintenance Revlimid  since that time and by report in complete remission.  She has a chronic anemia that is likely related to Revlimid .  She also noticed an increase in bruising over the past weeks to months and self-referred for a second opinion on her bruising.  She intends to keep her myeloma care at [facility].  She otherwise feels well.  She has no neurologic complaints.  She denies any recent fevers or illnesses.  She has a good appetite and denies weight loss.  She has no chest pain, shortness of breath, cough, or hemoptysis.  She denies any nausea, vomiting, constipation, or diarrhea.  She has no urinary complaints.  Patient otherwise feels well and offers no further specific complaints today.  REVIEW OF SYSTEMS:   Review of Systems  Constitutional: Negative.  Negative for fever, malaise/fatigue and weight loss.  Respiratory: Negative.  Negative for cough and shortness of breath.   Cardiovascular: Negative.  Negative for chest pain and leg swelling.  Gastrointestinal: Negative.  Negative for abdominal pain, blood in stool and melena.  Genitourinary: Negative.  Negative for dysuria.  Musculoskeletal: Negative.  Negative for back pain.  Skin: Negative.  Negative for rash.  Neurological: Negative.  Negative for dizziness, focal weakness, weakness and headaches.  Endo/Heme/Allergies:  Bruises/bleeds easily.  Psychiatric/Behavioral: Negative.  The patient is not nervous/anxious.     As per HPI.  Otherwise, a complete review of systems is negative.  PAST MEDICAL HISTORY: Past Medical History:  Diagnosis Date   Cancer (HCC) 11/2011   multiple myeloma   Congestive heart failure (CHF) (HCC)     PAST SURGICAL HISTORY: Past Surgical History:  Procedure Laterality Date   ABDOMINAL HYSTERECTOMY     BREAST BIOPSY Right 2011   neg   HAND SURGERY      FAMILY HISTORY: Family History  Problem Relation Age of Onset   Hyperthyroidism Mother    Heart disease Father     ADVANCED DIRECTIVES (Y/N):  N  HEALTH MAINTENANCE: Social History[1]   Colonoscopy:  PAP:  Bone density:  Lipid panel:  Allergies[2]  Current Outpatient Medications  Medication Sig Dispense Refill   allopurinol (ZYLOPRIM) 100 MG tablet Take 100 mg by mouth 2 (two) times daily.     amitriptyline  (ELAVIL ) 25 MG tablet Take by mouth.     aspirin  EC 81 MG tablet Take by mouth.     empagliflozin  (JARDIANCE ) 10 MG TABS tablet Take 1 tablet by mouth daily.     fluticasone (FLONASE) 50 MCG/ACT nasal spray Place 2 sprays into both nostrils daily as needed for allergies.     furosemide  (LASIX ) 40 MG tablet Take 40 mg by mouth daily as needed (for swelling).     gabapentin  (NEURONTIN ) 300 MG capsule Take 300 mg by mouth 3 (three) times daily.     lenalidomide  (REVLIMID ) 15 MG capsule Take 15 mg by mouth daily.     levETIRAcetam  (KEPPRA ) 500 MG tablet Take 1 tablet (500 mg total) by mouth every 12 (twelve) hours. 60 tablet 1   [Paused] losartan  (COZAAR ) 25 MG tablet  Take 1 tablet by mouth daily.     magnesium 30 MG tablet Take 30 mg by mouth 2 (two) times daily.     metoprolol  succinate (TOPROL -XL) 25 MG 24 hr tablet Take 0.5 tablets (12.5 mg total) by mouth daily. 30 tablet 1   midodrine  (PROAMATINE ) 5 MG tablet Take 1 tablet (5 mg total) by mouth 3 (three) times daily with meals. 90 tablet 0   mirtazapine (REMERON) 15 MG tablet Take 15 mg by mouth at bedtime.     potassium chloride  (KLOR-CON ) 20 MEQ packet Take by  mouth 2 (two) times daily.     promethazine -dextromethorphan (PROMETHAZINE -DM) 6.25-15 MG/5ML syrup Take 5 mLs by mouth 4 (four) times daily as needed. 118 mL 0   [Paused] spironolactone (ALDACTONE) 25 MG tablet Take 1 tablet by mouth daily.     WEGOVY 2.4 MG/0.75ML SOAJ Inject 2.4 mg into the skin once a week.     No current facility-administered medications for this visit.    OBJECTIVE: Vitals:   01/14/24 1150  BP: 118/75  Pulse: 71  Resp: 20  Temp: 97.9 F (36.6 C)  SpO2: 100%     Body mass index is 31.27 kg/m.    ECOG FS:0 - Asymptomatic  General: Well-developed, well-nourished, no acute distress. Eyes: Pink conjunctiva, anicteric sclera. HEENT: Normocephalic, moist mucous membranes. Lungs: No audible wheezing or coughing. Heart: Regular rate and rhythm. Abdomen: Soft, nontender, no obvious distention. Musculoskeletal: No edema, cyanosis, or clubbing. Neuro: Alert, answering all questions appropriately. Cranial nerves grossly intact. Skin: No rashes or petechiae noted. Psych: Normal affect. Lymphatics: No cervical, calvicular, axillary or inguinal LAD.   LAB RESULTS:  Lab Results  Component Value Date   NA 138 07/15/2023   K 3.8 07/15/2023   CL 104 07/15/2023   CO2 25 07/15/2023   GLUCOSE 89 07/15/2023   BUN 17 07/15/2023   CREATININE 1.08 (H) 07/15/2023   CALCIUM 8.6 (L) 07/15/2023   PROT 6.6 07/08/2023   ALBUMIN 3.2 (L) 07/08/2023   AST 29 07/08/2023   ALT 10 07/08/2023   ALKPHOS 53 07/08/2023   BILITOT 0.9 07/08/2023   GFRNONAA 60 (L) 07/15/2023   GFRAA >60 04/03/2013    Lab Results  Component Value Date   WBC 2.0 (L) 01/14/2024   NEUTROABS 4.6 07/08/2023   HGB 9.1 (L) 01/14/2024   HCT 29.0 (L) 01/14/2024   MCV 97.6 01/14/2024   PLT 191 01/14/2024     STUDIES: No results found.  ASSESSMENT: Multiple myeloma in remission, anemia, easy bruising.  PLAN:    Multiple myeloma: Patient underwent treatment and autologous bone marrow transplant  in approximately 2021 and by report has been in complete remission since that time.  She continues with maintenance Revlimid .  Patient has an appointment at [facility] on January 19, 2024.  She can plans to continue follow-up with them and does not intend to transfer care. Anemia: Chronic and unchanged.  Patient's hemoglobin today is 9.1.  Likely related to history of transplant as well as possibly related to Revlimid .  Iron stores, B12, folate were ordered for completeness and are pending at time of dictation. Easy bruising: Unclear etiology.  Patient has a normal platelet count.  PTT, PT, platelet function assay, and von Willebrand's panel are all pending at time of dictation.  No intervention is needed.  Patient will have a video-assisted telemedicine visit in approximately 2 weeks to discuss the results.  I spent a total of 45 minutes reviewing chart data, face-to-face evaluation with the  patient, counseling and coordination of care as detailed above.  Patient expressed understanding and was in agreement with this plan. She also understands that She can call clinic at any time with any questions, concerns, or complaints.    Carly JINNY Reusing, MD   01/14/2024 1:31 PM        [1]  Social History Tobacco Use   Smoking status: Never   Smokeless tobacco: Never  Vaping Use   Vaping status: Never Used  Substance Use Topics   Alcohol use: No   Drug use: Never  [2]  Allergies Allergen Reactions   Filgrastim Other (See Comments)    Other reaction(s): Other (See Comments) Other reaction(s): Other (See Comments) Severe pain Severe pain Severe pain    Pegfilgrastim     Other reaction(s): Other (See Comments) Severe bone pain Other reaction(s): Other (See Comments) Severe bone pain    Percocet [Oxycodone-Acetaminophen ] Itching   Codeine Itching   Naproxen Itching

## 2024-01-14 NOTE — Progress Notes (Signed)
 Patient is here for her chronic anemia, she is currently on oral chemo.

## 2024-01-15 LAB — PLATELET ANTIBODY PROFILE
Glycoprotein IV Antibody: NEGATIVE
HLA Ab Ser Ql EIA: NEGATIVE
IA/IIA Antibody: NEGATIVE
IB/IX Antibody: NEGATIVE
IIB/IIIA Antibody: NEGATIVE

## 2024-01-15 LAB — VON WILLEBRAND PANEL
Coagulation Factor VIII: 135 % (ref 56–140)
Ristocetin Co-factor, Plasma: 146 % (ref 50–200)
Von Willebrand Antigen, Plasma: 142 % (ref 50–200)

## 2024-01-15 LAB — COAG STUDIES INTERP REPORT

## 2024-02-02 ENCOUNTER — Inpatient Hospital Stay: Admitting: Oncology

## 2024-02-02 ENCOUNTER — Encounter: Payer: Self-pay | Admitting: Oncology

## 2024-02-02 DIAGNOSIS — D649 Anemia, unspecified: Secondary | ICD-10-CM | POA: Diagnosis not present

## 2024-02-02 DIAGNOSIS — C9001 Multiple myeloma in remission: Secondary | ICD-10-CM | POA: Diagnosis not present

## 2024-02-02 NOTE — Progress Notes (Signed)
 Patient states that she is still having the easy bruising.

## 2024-02-02 NOTE — Progress Notes (Signed)
 " [facility]  [phone] [phone]  ID: Carly Randall OB: [birth year]  MR#: [id number]  RDW#:[id number]  Patient Care Team: Tobie Border, MD as PCP - General (Family Medicine) Jacobo Evalene PARAS, MD as Consulting Physician (Oncology)  I connected with Carly Randall on 02/02/2024 at 10:45 AM EST by video enabled telemedicine visit and verified that I am speaking with the correct person using two identifiers.   I discussed the limitations, risks, security and privacy concerns of performing an evaluation and management service by telemedicine and the availability of in-person appointments. I also discussed with the patient that there may be a patient responsible charge related to this service. The patient expressed understanding and agreed to proceed.   Other persons participating in the visit and their role in the encounter: Patient, MD.  Patients location: Home. Providers location: Clinic.  CHIEF COMPLAINT: Multiple myeloma in remission, anemia, easy bruising.  INTERVAL HISTORY: Patient agreed to video-assisted telemedicine visit for further evaluation and discussion of her laboratory results.  She continues to take Revlimid  and aspirin .  She continues to have occasional bruising, but otherwise feels well.   She has no neurologic complaints.  She denies any recent fevers or illnesses.  She has a good appetite and denies weight loss.  She has no chest pain, shortness of breath, cough, or hemoptysis.  She denies any nausea, vomiting, constipation, or diarrhea.  She has no urinary complaints.  Patient offers no further specific complaints today.  REVIEW OF SYSTEMS:   Review of Systems  Constitutional: Negative.  Negative for fever, malaise/fatigue and weight loss.  Respiratory: Negative.  Negative for cough and shortness of breath.   Cardiovascular: Negative.  Negative for chest pain and leg swelling.  Gastrointestinal: Negative.   Negative for abdominal pain, blood in stool and melena.  Genitourinary: Negative.  Negative for dysuria.  Musculoskeletal: Negative.  Negative for back pain.  Skin: Negative.  Negative for rash.  Neurological: Negative.  Negative for dizziness, focal weakness, weakness and headaches.  Endo/Heme/Allergies:  Bruises/bleeds easily.  Psychiatric/Behavioral: Negative.  The patient is not nervous/anxious.     As per HPI. Otherwise, a complete review of systems is negative.  PAST MEDICAL HISTORY: Past Medical History:  Diagnosis Date   Cancer (HCC) 11/2011   multiple myeloma   Congestive heart failure (CHF) (HCC)     PAST SURGICAL HISTORY: Past Surgical History:  Procedure Laterality Date   ABDOMINAL HYSTERECTOMY     BREAST BIOPSY Right 2011   neg   HAND SURGERY      FAMILY HISTORY: Family History  Problem Relation Age of Onset   Hyperthyroidism Mother    Heart disease Father     ADVANCED DIRECTIVES (Y/N):  N  HEALTH MAINTENANCE: Social History[1]   Colonoscopy:  PAP:  Bone density:  Lipid panel:  Allergies[2]  Current Outpatient Medications  Medication Sig Dispense Refill   allopurinol (ZYLOPRIM) 100 MG tablet Take 100 mg by mouth 2 (two) times daily.     amitriptyline  (ELAVIL ) 25 MG tablet Take by mouth.     aspirin  EC 81 MG tablet Take by mouth.     empagliflozin  (JARDIANCE ) 10 MG TABS tablet Take 1 tablet by mouth daily.     fluticasone (FLONASE) 50 MCG/ACT nasal spray Place 2 sprays into both nostrils daily as needed for allergies.     furosemide  (LASIX ) 40 MG tablet Take 40 mg by mouth daily as needed (for swelling).     gabapentin  (NEURONTIN ) 300 MG  capsule Take 300 mg by mouth 3 (three) times daily.     lenalidomide  (REVLIMID ) 15 MG capsule Take 15 mg by mouth daily.     levETIRAcetam  (KEPPRA ) 500 MG tablet Take 1 tablet (500 mg total) by mouth every 12 (twelve) hours. 60 tablet 1   magnesium 30 MG tablet Take 30 mg by mouth 2 (two) times daily.      metoprolol  succinate (TOPROL -XL) 25 MG 24 hr tablet Take 0.5 tablets (12.5 mg total) by mouth daily. 30 tablet 1   midodrine  (PROAMATINE ) 5 MG tablet Take 1 tablet (5 mg total) by mouth 3 (three) times daily with meals. 90 tablet 0   mirtazapine (REMERON) 15 MG tablet Take 15 mg by mouth at bedtime.     potassium chloride  (KLOR-CON ) 20 MEQ packet Take by mouth 2 (two) times daily.     promethazine -dextromethorphan (PROMETHAZINE -DM) 6.25-15 MG/5ML syrup Take 5 mLs by mouth 4 (four) times daily as needed. 118 mL 0   WEGOVY 2.4 MG/0.75ML SOAJ Inject 2.4 mg into the skin once a week.     No current facility-administered medications for this visit.    OBJECTIVE: There were no vitals filed for this visit.    There is no height or weight on file to calculate BMI.    ECOG FS:0 - Asymptomatic  General: Well-developed, well-nourished, no acute distress. HEENT: Normocephalic. Neuro: Alert, answering all questions appropriately. Cranial nerves grossly intact. Psych: Normal affect.  LAB RESULTS:  Lab Results  Component Value Date   NA 138 07/15/2023   K 3.8 07/15/2023   CL 104 07/15/2023   CO2 25 07/15/2023   GLUCOSE 89 07/15/2023   BUN 17 07/15/2023   CREATININE 1.08 (H) 07/15/2023   CALCIUM 8.6 (L) 07/15/2023   PROT 6.6 07/08/2023   ALBUMIN 3.2 (L) 07/08/2023   AST 29 07/08/2023   ALT 10 07/08/2023   ALKPHOS 53 07/08/2023   BILITOT 0.9 07/08/2023   GFRNONAA 60 (L) 07/15/2023   GFRAA >60 04/03/2013    Lab Results  Component Value Date   WBC 2.0 (L) 01/14/2024   NEUTROABS 4.6 07/08/2023   HGB 9.1 (L) 01/14/2024   HCT 29.0 (L) 01/14/2024   MCV 97.6 01/14/2024   PLT 191 01/14/2024     STUDIES: No results found.  ASSESSMENT: Multiple myeloma in remission, anemia, easy bruising.  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  She continues with  maintenance Revlimid .  Continue follow-up with [facility] every 3 months as scheduled. Anemia: Chronic and unchanged.  Patient's most recent hemoglobin is 9.1.  Likely related to history of transplant as well as possibly related to Revlimid .  All of her other laboratory work including iron stores, B12, folate are all within normal limits.   Easy bruising: Likely related to underlying aspirin  use.  Patient has a normal platelet count.  PTT, PT, and von Willebrand's panel are all within normal limits.  Patient's platelet function assay is elevated, but this is secondary to ongoing aspirin  use.  No intervention is needed.  No further follow-up is necessary.     I provided 20 minutes of face-to-face video visit time during this encounter which included chart review, counseling, and coordination of care as documented above.  Patient expressed understanding and was in agreement with this plan. She also understands that She can call clinic at any time with any questions, concerns, or complaints.  Evalene JINNY Reusing, MD   02/02/2024 2:24 PM          [1]  Social History Tobacco Use   Smoking status: Never   Smokeless tobacco: Never  Vaping Use   Vaping status: Never Used  Substance Use Topics   Alcohol use: No   Drug use: Never  [2]  Allergies Allergen Reactions   Filgrastim Other (See Comments)    Other reaction(s): Other (See Comments) Other reaction(s): Other (See Comments) Severe pain Severe pain Severe pain    Pegfilgrastim     Other reaction(s): Other (See Comments) Severe bone pain Other reaction(s): Other (See Comments) Severe bone pain    Percocet [Oxycodone-Acetaminophen ] Itching   Codeine Itching   Naproxen Itching   "

## 2024-03-15 ENCOUNTER — Institutional Professional Consult (permissible substitution): Admitting: Plastic Surgery
